# Patient Record
Sex: Female | Born: 1965 | Race: White | Hispanic: No | Marital: Married | Smoking: Never smoker
Health system: Southern US, Community
[De-identification: ages and names within clinical notes are randomized; demographics above are authoritative.]

## PROBLEM LIST (undated history)

## (undated) DIAGNOSIS — T7840XA Allergy, unspecified, initial encounter: Secondary | ICD-10-CM

## (undated) DIAGNOSIS — C210 Malignant neoplasm of anus, unspecified: Secondary | ICD-10-CM

## (undated) HISTORY — DX: Malignant neoplasm of anus, unspecified: C21.0

## (undated) HISTORY — PX: ABLATION: SHX5711

## (undated) HISTORY — PX: TUBAL LIGATION: SHX77

## (undated) HISTORY — DX: Allergy, unspecified, initial encounter: T78.40XA

---

## 2000-08-27 ENCOUNTER — Other Ambulatory Visit: Admission: RE | Admit: 2000-08-27 | Discharge: 2000-08-27 | Payer: Self-pay | Admitting: *Deleted

## 2002-01-08 ENCOUNTER — Other Ambulatory Visit: Admission: RE | Admit: 2002-01-08 | Discharge: 2002-01-08 | Payer: Self-pay | Admitting: *Deleted

## 2003-01-21 ENCOUNTER — Other Ambulatory Visit: Admission: RE | Admit: 2003-01-21 | Discharge: 2003-01-21 | Payer: Self-pay | Admitting: *Deleted

## 2005-08-09 ENCOUNTER — Other Ambulatory Visit: Admission: RE | Admit: 2005-08-09 | Discharge: 2005-08-09 | Payer: Self-pay | Admitting: *Deleted

## 2006-10-16 ENCOUNTER — Other Ambulatory Visit: Admission: RE | Admit: 2006-10-16 | Discharge: 2006-10-16 | Payer: Self-pay | Admitting: *Deleted

## 2007-11-03 ENCOUNTER — Other Ambulatory Visit: Admission: RE | Admit: 2007-11-03 | Discharge: 2007-11-03 | Payer: Self-pay | Admitting: *Deleted

## 2009-09-16 ENCOUNTER — Ambulatory Visit: Payer: Self-pay | Admitting: Family Medicine

## 2010-02-10 ENCOUNTER — Ambulatory Visit: Payer: Self-pay | Admitting: Family Medicine

## 2010-02-28 LAB — HM COLONOSCOPY

## 2010-03-10 ENCOUNTER — Encounter: Admission: RE | Admit: 2010-03-10 | Discharge: 2010-03-10 | Payer: Self-pay | Admitting: Gastroenterology

## 2010-03-13 ENCOUNTER — Ambulatory Visit: Payer: Self-pay | Admitting: Oncology

## 2010-03-14 ENCOUNTER — Ambulatory Visit: Admission: RE | Admit: 2010-03-14 | Discharge: 2010-04-13 | Payer: Self-pay | Admitting: Radiation Oncology

## 2010-03-16 ENCOUNTER — Ambulatory Visit (HOSPITAL_COMMUNITY): Admission: RE | Admit: 2010-03-16 | Discharge: 2010-03-16 | Payer: Self-pay | Admitting: Gastroenterology

## 2010-03-17 LAB — CBC WITH DIFFERENTIAL/PLATELET
BASO%: 0.4 % (ref 0.0–2.0)
Basophils Absolute: 0 10*3/uL (ref 0.0–0.1)
EOS%: 1.7 % (ref 0.0–7.0)
HCT: 38.2 % (ref 34.8–46.6)
MCHC: 33.5 g/dL (ref 31.5–36.0)
MONO%: 7.2 % (ref 0.0–14.0)
NEUT%: 61.6 % (ref 38.4–76.8)
Platelets: 263 10*3/uL (ref 145–400)
RDW: 13.9 % (ref 11.2–14.5)
lymph#: 1.7 10*3/uL (ref 0.9–3.3)

## 2010-03-17 LAB — COMPREHENSIVE METABOLIC PANEL
ALT: 14 U/L (ref 0–35)
Albumin: 4.5 g/dL (ref 3.5–5.2)
Alkaline Phosphatase: 90 U/L (ref 39–117)
Calcium: 9.6 mg/dL (ref 8.4–10.5)
Creatinine, Ser: 0.67 mg/dL (ref 0.40–1.20)
Glucose, Bld: 85 mg/dL (ref 70–99)
Potassium: 3.8 mEq/L (ref 3.5–5.3)
Sodium: 138 mEq/L (ref 135–145)
Total Bilirubin: 0.5 mg/dL (ref 0.3–1.2)
Total Protein: 7.3 g/dL (ref 6.0–8.3)

## 2010-03-17 LAB — HIV ANTIBODY (ROUTINE TESTING W REFLEX)

## 2010-03-22 ENCOUNTER — Ambulatory Visit (HOSPITAL_COMMUNITY): Admission: RE | Admit: 2010-03-22 | Discharge: 2010-03-22 | Payer: Self-pay | Admitting: Radiation Oncology

## 2010-04-12 ENCOUNTER — Ambulatory Visit: Payer: Self-pay | Admitting: Family Medicine

## 2010-04-14 ENCOUNTER — Encounter: Admission: RE | Admit: 2010-04-14 | Discharge: 2010-04-14 | Payer: Self-pay | Admitting: Family Medicine

## 2010-05-09 ENCOUNTER — Encounter (INDEPENDENT_AMBULATORY_CARE_PROVIDER_SITE_OTHER): Payer: Self-pay | Admitting: Surgery

## 2010-05-09 ENCOUNTER — Ambulatory Visit (HOSPITAL_COMMUNITY): Admission: RE | Admit: 2010-05-09 | Discharge: 2010-05-10 | Payer: Self-pay | Admitting: Surgery

## 2010-05-09 HISTORY — PX: ANUS SURGERY: SHX302

## 2010-05-30 ENCOUNTER — Ambulatory Visit: Payer: Self-pay | Admitting: Oncology

## 2010-06-01 LAB — CBC WITH DIFFERENTIAL/PLATELET
BASO%: 0.4 % (ref 0.0–2.0)
Eosinophils Absolute: 0.1 10*3/uL (ref 0.0–0.5)
HCT: 36.4 % (ref 34.8–46.6)
LYMPH%: 30.2 % (ref 14.0–49.7)
MCH: 27.9 pg (ref 25.1–34.0)
MCHC: 33.6 g/dL (ref 31.5–36.0)
MCV: 83.2 fL (ref 79.5–101.0)
MONO#: 0.6 10*3/uL (ref 0.1–0.9)
NEUT#: 3.4 10*3/uL (ref 1.5–6.5)
Platelets: 323 10*3/uL (ref 145–400)
RDW: 14.6 % — ABNORMAL HIGH (ref 11.2–14.5)

## 2010-06-01 LAB — COMPREHENSIVE METABOLIC PANEL
AST: 15 U/L (ref 0–37)
Alkaline Phosphatase: 99 U/L (ref 39–117)
Chloride: 103 mEq/L (ref 96–112)
Potassium: 4.1 mEq/L (ref 3.5–5.3)
Sodium: 137 mEq/L (ref 135–145)
Total Bilirubin: 0.3 mg/dL (ref 0.3–1.2)

## 2010-08-17 ENCOUNTER — Ambulatory Visit: Payer: Self-pay | Admitting: Family Medicine

## 2010-09-21 ENCOUNTER — Ambulatory Visit: Payer: Self-pay | Admitting: Oncology

## 2010-09-25 LAB — CBC WITH DIFFERENTIAL/PLATELET
BASO%: 0.2 % (ref 0.0–2.0)
Basophils Absolute: 0 10*3/uL (ref 0.0–0.1)
EOS%: 6 % (ref 0.0–7.0)
HCT: 38.9 % (ref 34.8–46.6)
LYMPH%: 33.3 % (ref 14.0–49.7)
MCH: 26.8 pg (ref 25.1–34.0)
MCV: 83.3 fL (ref 79.5–101.0)
NEUT#: 2.4 10*3/uL (ref 1.5–6.5)
Platelets: 285 10*3/uL (ref 145–400)
lymph#: 1.5 10*3/uL (ref 0.9–3.3)

## 2010-09-25 LAB — COMPREHENSIVE METABOLIC PANEL
Alkaline Phosphatase: 103 U/L (ref 39–117)
Chloride: 102 mEq/L (ref 96–112)
Potassium: 3.8 mEq/L (ref 3.5–5.3)
Sodium: 140 mEq/L (ref 135–145)
Total Bilirubin: 0.4 mg/dL (ref 0.3–1.2)
Total Protein: 7 g/dL (ref 6.0–8.3)

## 2010-12-17 ENCOUNTER — Encounter: Payer: Self-pay | Admitting: Oncology

## 2011-02-12 ENCOUNTER — Encounter: Payer: Self-pay | Admitting: Family Medicine

## 2011-02-12 LAB — PREGNANCY, URINE: Preg Test, Ur: NEGATIVE

## 2011-02-12 LAB — CBC: WBC: 5.6 10*3/uL (ref 4.0–10.5)

## 2011-02-13 LAB — GLUCOSE, CAPILLARY: Glucose-Capillary: 94 mg/dL (ref 70–99)

## 2011-02-16 ENCOUNTER — Ambulatory Visit (INDEPENDENT_AMBULATORY_CARE_PROVIDER_SITE_OTHER): Payer: 59 | Admitting: Family Medicine

## 2011-02-16 DIAGNOSIS — J0100 Acute maxillary sinusitis, unspecified: Secondary | ICD-10-CM

## 2011-02-16 DIAGNOSIS — D490 Neoplasm of unspecified behavior of digestive system: Secondary | ICD-10-CM

## 2011-02-16 DIAGNOSIS — J309 Allergic rhinitis, unspecified: Secondary | ICD-10-CM

## 2011-03-19 ENCOUNTER — Encounter (INDEPENDENT_AMBULATORY_CARE_PROVIDER_SITE_OTHER): Payer: 59 | Admitting: Family Medicine

## 2011-03-19 ENCOUNTER — Encounter: Payer: Self-pay | Admitting: Family Medicine

## 2011-03-19 DIAGNOSIS — N926 Irregular menstruation, unspecified: Secondary | ICD-10-CM

## 2011-03-19 DIAGNOSIS — Z0000 Encounter for general adult medical examination without abnormal findings: Secondary | ICD-10-CM

## 2011-04-24 ENCOUNTER — Ambulatory Visit (INDEPENDENT_AMBULATORY_CARE_PROVIDER_SITE_OTHER): Payer: 59 | Admitting: Medical

## 2011-04-24 ENCOUNTER — Encounter: Payer: Self-pay | Admitting: Medical

## 2011-04-24 VITALS — BP 102/72 | HR 60 | Temp 98.3°F | Ht 59.0 in | Wt 110.5 lb

## 2011-04-24 DIAGNOSIS — J329 Chronic sinusitis, unspecified: Secondary | ICD-10-CM

## 2011-04-24 MED ORDER — AMOXICILLIN 875 MG PO TABS: 875.0000 mg | ORAL_TABLET | Freq: Two times a day (BID) | ORAL | Status: DC

## 2011-04-24 NOTE — Patient Instructions (Signed)

## 2011-04-24 NOTE — Progress Notes (Signed)
Subjective:     Rebecca Bowers is a 45 y.o. female who presents for evaluation of sinus pain. Symptoms include: congestion, headaches, nasal congestion, sinus pressure and sore throat. Onset of symptoms was 4 days ago. Symptoms have been gradually worsening since that time. Past history is significant for allergy and sinus problems. Patient is a non-smoker.  Using Allegra and nasal saline flush for symptoms.  Denies sick contacts.  No other aggravating or relieving factors.  No other c/o.  The following portions of the patient's history were reviewed and updated as appropriate: allergies, current medications, past family history, past medical history, past social history, past surgical history and problem list.  Review of Systems Constitutional: +subj fever, denies chills, sweats, anorexia Skin: denies rash HEENT: +ear pressure; denies itchy watery eyes Cardiovascular: denies chest pain Lungs: +occasional cough, denies wheezing Abdomen: denies abdominal pain, nausea, vomiting, diarrhea GU: denies dysuria  Objective:   Filed Vitals:   04/24/11 1339  BP: 102/72  Pulse: 60  Temp: 98.3 F (36.8 C)    General appearance: Alert, WD/WN, no distress                             Skin: warm, no rash                           Head: +frontal and maxillary sinus tenderness,                            Eyes: conjunctiva normal, corneas clear, PERRLA                            Ears: pearly TMs, external ear canals normal                          Nose: septum midline, turbinates swollen, with erythema and clear discharge             Mouth/throat: MMM, tongue normal, mild pharyngeal erythema                           Neck: supple, no adenopathy, no thyromegaly, nontender                          Heart: RRR, normal S1, S2, no murmurs                         Lungs: CTA bilaterally, no wheezes, rales, or rhonchi      Assessment:    Acute bacterial sinusitis.    Plan:   Prescription given for  Amoxicillin.  Can use OTC Allegra or Mucinex for congestion.  Tylenol or Ibuprofen OTC for fever and malaise.  Discussed symptomatic relief, nasal saline, and call or return if worse or not improving in 2-3 days.

## 2011-07-19 ENCOUNTER — Encounter: Payer: Self-pay | Admitting: Family Medicine

## 2011-09-19 ENCOUNTER — Ambulatory Visit (INDEPENDENT_AMBULATORY_CARE_PROVIDER_SITE_OTHER): Payer: 59 | Admitting: Surgery

## 2011-09-19 ENCOUNTER — Encounter (INDEPENDENT_AMBULATORY_CARE_PROVIDER_SITE_OTHER): Payer: Self-pay | Admitting: Surgery

## 2011-09-19 VITALS — BP 114/68 | HR 60 | Temp 97.3°F | Resp 14 | Ht 59.0 in | Wt 113.8 lb

## 2011-09-19 DIAGNOSIS — D013 Carcinoma in situ of anus and anal canal: Secondary | ICD-10-CM | POA: Insufficient documentation

## 2011-09-19 NOTE — Progress Notes (Signed)
Subjective:     Patient ID: Rebecca Bowers, female   DOB: [birth year], 45 y.o.   MRN: [id number]  HPI  Patient Care Team: Carollee Herter, MD as PCP - General (Family Medicine) Theda Belfast as Consulting Physician (Gastroenterology) Jethro Bolus, MD as Consulting Physician (Hematology and Oncology) Billie Lade as Consulting Physician (Radiation Oncology)  This patient is a 45 y.o.female who presents today for surgical evaluation.   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 No anal pain. No rectal bleeding. Daily bowel movements. No fevers chills or sweats. Asymptomatic.  Past Medical History  Diagnosis Date  . Allergy   . 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 Tubal ligation     BILATERAL  . Cesarean section     X2  . Anus surgery 05/09/2010    Excision of AIN-III right & left anterior    History   Social History  . Marital Status: Married    Spouse Name: N/A    Number of Children: N/A  . Years of Education: N/A   Occupational History  . Not on file.   Social History Main Topics  . Smoking status: Never Smoker   . Smokeless tobacco: Never Used  . Alcohol Use: 0.5 oz/week    1 drink(s) per week     1 every 3-4 months  . Drug Use: No  . Sexually Active: Not on file   Other Topics Concern  . Not on file   Social History Narrative  . No narrative on file    Family History  Problem Relation Age of Onset  . Cancer Father 69    lung  . Cancer Sister     lung    Current outpatient prescriptions:Multiple Vitamin (MULTIVITAMIN) capsule, Take 1 capsule by mouth daily.  , Disp: , Rfl: ;  fexofenadine-pseudoephedrine (ALLEGRA-D 24) 180-240 MG per 24 hr tablet, Take 1 tablet by mouth daily.  , Disp: , Rfl:   No Known Allergies     Review of Systems  Constitutional: Negative for fever, chills,  diaphoresis, appetite change and fatigue.  HENT: Negative for ear pain, sore throat, trouble swallowing, neck pain and ear discharge.   Eyes: Negative for photophobia, discharge and visual disturbance.  Respiratory: Negative for cough, choking, chest tightness and shortness of breath.   Cardiovascular: Negative for chest pain and palpitations.  Gastrointestinal: Negative for nausea, vomiting, abdominal pain, diarrhea, constipation, blood in stool, abdominal distention, anal bleeding and rectal pain.  Genitourinary: Negative for dysuria, frequency and difficulty urinating.  Musculoskeletal: Negative for myalgias and gait problem.  Skin: Negative for color change, pallor and rash.  Neurological: Negative for dizziness, speech difficulty, weakness and numbness.  Hematological: Negative for adenopathy.  Psychiatric/Behavioral: Negative for confusion and agitation. The patient is not nervous/anxious.        Objective:   Physical Exam  Constitutional: She is oriented to person, place, and time. She appears well-developed and well-nourished. No distress.  HENT:  Head: Normocephalic.  Mouth/Throat: Oropharynx is clear and moist. No oropharyngeal exudate.  Eyes: Conjunctivae and EOM are normal. Pupils are equal, round, and reactive to light. No scleral icterus.  Neck: Normal range of motion. Neck supple. No tracheal deviation present.  Cardiovascular: Normal rate, regular rhythm and intact distal pulses.   Pulmonary/Chest: Effort  normal and breath sounds normal. No respiratory distress. She exhibits no tenderness.  Abdominal: Soft. She exhibits no distension and no mass. There is no tenderness. There is no guarding. Hernia confirmed negative in the right inguinal area and confirmed negative in the left inguinal area.       Incisions clean with normal healing ridges.  No hernias  Genitourinary: Vagina normal. No vaginal discharge found.       Perianal skin clean with good hygiene.  No pruritis.   Normal sphincter tone.  No fissure.  No abscess/fistula.  No pilonidal disease.    Tolerates digital and anoscopic rectal exam.  No rectal masses.   Hemorrhoidal piles WNL.  Anterior 50% circ scar at anosqamous junction.   No recurrence.  Mucosa clean   Musculoskeletal: Normal range of motion. She exhibits no tenderness.  Lymphadenopathy:    She has no cervical adenopathy.       Right: No inguinal adenopathy present.       Left: No inguinal adenopathy present.  Neurological: She is alert and oriented to person, place, and time. No cranial nerve deficit. She exhibits normal muscle tone. Coordination normal.  Skin: Skin is warm and dry. No rash noted. She is not diaphoretic. No erythema.  Psychiatric: She has a normal mood and affect. Her behavior is normal. Judgment and thought content normal.       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  The patient expressed understanding and appreciation

## 2011-12-10 ENCOUNTER — Ambulatory Visit (INDEPENDENT_AMBULATORY_CARE_PROVIDER_SITE_OTHER): Payer: 59 | Admitting: Family Medicine

## 2011-12-10 ENCOUNTER — Encounter: Payer: Self-pay | Admitting: Family Medicine

## 2011-12-10 VITALS — BP 108/70 | HR 76 | Temp 98.8°F | Ht 59.0 in | Wt 118.0 lb

## 2011-12-10 DIAGNOSIS — H8309 Labyrinthitis, unspecified ear: Secondary | ICD-10-CM

## 2011-12-10 NOTE — Patient Instructions (Signed)
URI with acute labyrinthitis.  Treat with conservative measures--continue Zyrtec D for sinus congestion.  Start sinus rinses and consider adding Mucinex (or other expectorant).  Trial of OTC meclizine, ie in Bonine, or less drowsy formula of Dramamine, and likely other OTC names of meclizine--can check with pharmacist.  May use 12.5-25 mg every 6-8 hours as needed for dizziness.  May cause some sedation.  Follow up if symptoms persist or worsen.  Labyrinthitis (Inner Ear Inflammation) Your exam shows you have an inner ear disturbance or labyrinthitis. The cause of this condition is not known. But it may be due to a virus infection. The symptoms of labyrinthitis include vertigo or dizziness made worse by motion, nausea and vomiting. The onset of labyrinthitis may be very sudden. It usually lasts for a few days and then clears up over 1-2 weeks. The treatment of an inner ear disturbance includes bed rest and medications to reduce dizziness, nausea, and vomiting. You should stay away from alcohol, tranquilizers, caffeine, nicotine, or any medicine your doctor thinks may make your symptoms worse. Further testing may be needed to evaluate your hearing and balance system. Please see your doctor or go to the emergency room right away if you have:  Increasing vertigo, earache, loss of hearing, or ear drainage.   Headache, blurred vision, trouble walking, fainting, or fever.   Persistent vomiting, dehydration, or extreme weakness.  Document Released: 11/12/2005 Document Revised: 07/25/2011 Document Reviewed: 04/30/2007 [facility] Patient Information 2012 [place], [place].

## 2011-12-10 NOTE — Progress Notes (Signed)
Chief complaint:  sinus drainage since Wed or Thurs of last week. Been experiencing dizziness since Saturday that patient equates to "feeling like I just got off boat."  HPI:  Began with postnasal drainage 4 days ago.  2 days ago started having problems with dizziness, worse yesterday. Dizziness is worse with movement, no specific position.  Has vertigo (room spins) when lying down.  Has been using Zyrtec-D daily (even prior to illness).  Not having any nasal drainage or blowing her nose.  Having some pressure in her cheeks and pressure in her right ear.  Denies any ringing in ear or hearing loss.  Coughs more when lying down, due to postnasal drip.  Occasionally gets up some discolored mucus.    Past Medical History  Diagnosis Date  . Allergy   . 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 Tubal ligation     BILATERAL  . Cesarean section     X2  . Anus surgery 05/09/2010    Excision of AIN-III right & left anterior    History   Social History  . Marital Status: Married    Spouse Name: N/A    Number of Children: N/A  . Years of Education: N/A   Occupational History  . shipping clerk    Social History Main Topics  . Smoking status: Never Smoker   . Smokeless tobacco: Never Used  . Alcohol Use: 0.5 oz/week    1 drink(s) per week     1 every 3-4 months  . Drug Use: No  . Sexually Active: Yes -- Female partner(s)    Birth Control/ Protection: Surgical     tubal ligation   Other Topics Concern  . Not on file   Social History Narrative  . No narrative on file   Family History  Problem Relation Age of Onset  . Cancer Father 71    lung  . Cancer Sister     lung   Current outpatient prescriptions:cetirizine-pseudoephedrine (ZYRTEC-D) 5-120 MG per tablet, Take 1 tablet by mouth 2 (two) times daily., Disp: , Rfl: ;  Multiple Vitamin (MULTIVITAMIN) capsule, Take 1 capsule by mouth daily.  , Disp: , Rfl:   No Known  Allergies  ROS:  Denies fevers.  Some nausea related to dizziness, but no vomiting or diarrhea.  Denies skin rash, body aches, chest pain, shortness of breath.  PHYSICAL EXAM: Well developed, pleasant female in no distress. BP 108/70  Pulse 76  Temp(Src) 98.8 F (37.1 C) (Oral)  Ht 4\' 11"  (1.499 m)  Wt 118 lb (53.524 kg)  BMI 23.83 kg/m2  LMP 11/16/2011 Increased dizziness when looking up and to the left (with just eye movement, no head movement). Some dizziness with turning head to left also. HEENT:  PERRL, EOMI, conjunctiva clear, fundi normal. TM's and EAC's normal, except mildly retracted on right.  No erythema Nasal mucosa mildly edematous, no erythema or purulence.  Tender over bilateral frontal sinuses.  OP clear without erythema Neck: no lymphadenopathy or mass Heart: regular rate and rhythm without murmur Lungs: clear bilaterally Neuro: alert and oriented x 3.  Normal strength, sensation, DTR's.  Cranial nerves intact.  Mild nystagmus with various eye movements.  Neuro exam normal  ASSESSMENT/PLAN:  1. Labyrinthitis, acute viral    URI with acute labyrinthitis.  No specific positional component demonstrated by history or exam Treat with conservative measures--continue Zyrtec D for sinus  congestion.  Start sinus rinses and consider adding Mucinex (or other expectorant).  Trial of OTC meclizine, ie in Bonine, or less drowsy formula of Dramamine, and likely other OTC names of meclizine--can check with pharmacist.  May use 12.5-25 mg every 6-8 hours as needed for dizziness.  May cause some sedation.

## 2012-03-24 ENCOUNTER — Encounter (INDEPENDENT_AMBULATORY_CARE_PROVIDER_SITE_OTHER): Payer: Self-pay | Admitting: Surgery

## 2012-03-24 ENCOUNTER — Ambulatory Visit (INDEPENDENT_AMBULATORY_CARE_PROVIDER_SITE_OTHER): Payer: 59 | Admitting: Surgery

## 2012-03-24 VITALS — BP 98/60 | HR 70 | Resp 16 | Ht 59.0 in | Wt 111.0 lb

## 2012-03-24 DIAGNOSIS — D013 Carcinoma in situ of anus and anal canal: Secondary | ICD-10-CM

## 2012-03-24 NOTE — Patient Instructions (Signed)
Anal intraepithelial neoplasia (AIN) Information for patients What is anal intraepithelial neoplasia (AIN)? AIN is the name given to the appearance of abnormal cells in the skin just inside or immediately outside the anus. Sometimes AIN occurs in both places at the same time, and in women, may occur at nearby sites of the vulva (VIN) and cervix (CIN). There are different grades of AIN according to how the cells look under the microscope, from AIN1 (minor changes) to AIN3 (more severe changes). Why is AIN important? AIN is important because there is a risk that it can transform into anal cancer over many years. Overall, this risk is low, though certain risk factors associated with immunodeficiency such as organ transplants and HIV infection, increase the risk. What are the symptoms of AIN? Most people who have AIN have no symptoms and don't know they have it. If symptoms do occur, they may include discolouration of the skin, itching, pain, lumpy skin or very rarely, bleeding. AIN3 does not cause more symptoms than AIN1. What causes AIN? The precise cause of AIN is unknown. However, AIN is often associated with the presence of a virus known as the human papilloma virus (HPV). Smoking, usually in combination with the HPV infection, accelerates the development of AIN. What is human papilloma virus (HPV) and what are the symptoms? HPV is one the most common viruses in the world. Most people who are sexually active will encounter HPV at some stage in their lives. The majority of people infected with HPV have no symptoms or signs and their immune system gets rid of it successfully. However, in a minority of people, the infection persists and they may then go on to develop warts or abnormal skin patches. How is HPV transmitted? HPV is spread by skin-to-skin contact during oral, vaginal or anal sex with an infected partner. How can transmission be prevented? The only way to prevent an HPV  infection is to avoid direct skin-to-skin contact with an infected person. Using condoms may partially reduce your risk of developing diseases linked to HPV. Condoms also provide excellent protection against other sexually transmitted infections. It is possible that the new vaccines designed to prevent cervical cancer may partially prevent the development of AIN and anal cancer, although long-term trials are still evaluating the outcomes. Diagnosis and Treatment What tests can be done to diagnose AIN? Close examination of the external anal area by a specialist may suggest a diagnosis of AIN. However, changes can often be very mild and easily missed. A biopsy or sample of tissue is taken (usually under a general anaesthetic) to obtain a definitive diagnosis and establish the grade of AIN. Some specialists use an anal smear (the 'Pap' smear) test or look at the anal skin with a highresolution visual instrument (an anoscopy). However, these methods are not commonly used and we are not certain how effective they are. 697 AIN Patient Information Page 2 of 2 How is AIN treated? If you have AIN, it is important that you discuss the matter with your specialist. The body can completely heal itself from AIN, particularly AIN1, so it may be appropriate to monitor you regularly. The most commonly used treatments are: surgical removal, laser or diathermy*. In a few cases, a specialist may prescribe skin ointments such as 5-flourouracil cream. Currently, we do not know whether these treatments prevent the development of anal cancer. *diathermy - high-frequency electromagnetic currents are used to generate local heat in body tissues to destroy abnormal cells. What should I expect after treatment?  If you have AIN3, close follow-up will be recommended with careful clinical examination at intervals of approximately 6 months over many years. If the specialist has concerns at these examinations, you may need  further biopsies. Other advice Smoking cessation Smoking makes cell changes happen more quickly, so we advise you to stop smoking. . A FREE smoking cessation service is available for all patients at [facility]. Contact the Smoking Cessation Therapists on 0161 446 8236. . [facility] can advise on complementary services nationwide 539 675 [phone] . QUIT(smokers' quitline provides support and advice) - 0800 00 22 00. Cervical cancer screening in women In many women with AIN, there is a link with abnormalities of the cervix. We recommend regular cervical screening. High risk patient groups HIV and immuno-compromised patients Patients who are immuno-compromised are at increased risk of the development of AIN, and when it occurs, the AIN to anal cancer process may speed up. These groups include: . HIV-positive patients . Men who have sex with men, HIV-negative . Renal (kidney) transplant patients . Cardiac (heart) transplan

## 2012-03-24 NOTE — Progress Notes (Signed)
Subjective:     Patient ID: Rebecca Bowers, female   DOB: 1966-02-09, 46 y.o.   MRN: [id number]  HPI   Patient Care Team: Ronnald Nian, MD as PCP - General (Family Medicine) Theda Belfast, MD as Consulting Physician (Gastroenterology) Exie Parody, MD as Consulting Physician (Hematology and Oncology) Billie Lade, MD as Consulting Physician (Radiation Oncology)  This patient is a 46 y.o.female who presents today for surgical evaluation.   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 No anal pain. No rectal bleeding. Daily bowel movements. No fevers chills or sweats. Asymptomatic.  Past Medical History  Diagnosis Date  . Allergy   . 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 Tubal ligation     BILATERAL  . Cesarean section     X2  . Anus surgery 05/09/2010    Excision of AIN-III right & left anterior    History   Social History  . Marital Status: Married    Spouse Name: N/A    Number of Children: N/A  . Years of Education: N/A   Occupational History  . shipping clerk    Social History Main Topics  . Smoking status: Never Smoker   . Smokeless tobacco: Never Used  . Alcohol Use: 0.5 oz/week    1 drink(s) per week     1 every 3-4 months  . Drug Use: No  . Sexually Active: Yes -- Female partner(s)    Birth Control/ Protection: Surgical     tubal ligation   Other Topics Concern  . Not on file   Social History Narrative  . No narrative on file    Family History  Problem Relation Age of Onset  . Cancer Father 22    lung  . Cancer Sister     lung    Current outpatient prescriptions:cetirizine-pseudoephedrine (ZYRTEC-D) 5-120 MG per tablet, Take 1 tablet by mouth 2 (two) times daily., Disp: , Rfl: ;  Multiple Vitamin (MULTIVITAMIN) capsule, Take 1 capsule by mouth daily.  , Disp: , Rfl:   No Known  Allergies     Review of Systems  Constitutional: Negative for fever, chills, diaphoresis, appetite change and fatigue.  HENT: Negative for ear pain, sore throat, trouble swallowing, neck pain and ear discharge.   Eyes: Negative for photophobia, discharge and visual disturbance.  Respiratory: Negative for cough, choking, chest tightness and shortness of breath.   Cardiovascular: Negative for chest pain and palpitations.  Gastrointestinal: Negative for nausea, vomiting, abdominal pain, diarrhea, constipation, blood in stool, abdominal distention, anal bleeding and rectal pain.  Genitourinary: Negative for dysuria, frequency and difficulty urinating.  Musculoskeletal: Negative for myalgias and gait problem.  Skin: Negative for color change, pallor and rash.  Neurological: Negative for dizziness, speech difficulty, weakness and numbness.  Hematological: Negative for adenopathy.  Psychiatric/Behavioral: Negative for confusion and agitation. The patient is not nervous/anxious.        Objective:   Physical Exam  Constitutional: She is oriented to person, place, and time. She appears well-developed and well-nourished. No distress.  HENT:  Head: Normocephalic.  Mouth/Throat: Oropharynx is clear and moist. No oropharyngeal exudate.  Eyes: Conjunctivae and EOM are normal. Pupils are equal, round, and reactive to light. No scleral icterus.  Neck: Normal range of motion. Neck supple. No tracheal  deviation present.  Cardiovascular: Normal rate, regular rhythm and intact distal pulses.   Pulmonary/Chest: Effort normal and breath sounds normal. No respiratory distress. She exhibits no tenderness.  Abdominal: Soft. She exhibits no distension and no mass. There is no tenderness. There is no guarding. Hernia confirmed negative in the right inguinal area and confirmed negative in the left inguinal area.       Incisions clean with normal healing ridges.  No hernias  Genitourinary: Vagina normal. No  vaginal discharge found.       Perianal skin clean with good hygiene.  No pruritis.  Normal sphincter tone.  No fissure.  No abscess/fistula.  No pilonidal disease.    Tolerates digital and anoscopic rectal exam.  No rectal masses.   Hemorrhoidal piles WNL.  Anterior 50% circ scar at anosqamous junction.   No recurrence.  Mucosa clean   Musculoskeletal: Normal range of motion. She exhibits no tenderness.  Lymphadenopathy:    She has no cervical adenopathy.       Right: No inguinal adenopathy present.       Left: No inguinal adenopathy present.  Neurological: She is alert and oriented to person, place, and time. No cranial nerve deficit. She exhibits normal muscle tone. Coordination normal.  Skin: Skin is warm and dry. No rash noted. She is not diaphoretic. No erythema.  Psychiatric: She has a normal mood and affect. Her behavior is normal. Judgment and thought content normal.       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  The patient expressed understanding and appreciation

## 2012-11-04 ENCOUNTER — Encounter: Payer: Self-pay | Admitting: Medical

## 2012-11-04 ENCOUNTER — Ambulatory Visit (INDEPENDENT_AMBULATORY_CARE_PROVIDER_SITE_OTHER): Payer: 59 | Admitting: Medical

## 2012-11-04 ENCOUNTER — Telehealth: Payer: Self-pay | Admitting: Family Medicine

## 2012-11-04 VITALS — BP 110/70 | HR 80 | Temp 98.3°F | Resp 16 | Wt 114.0 lb

## 2012-11-04 DIAGNOSIS — J069 Acute upper respiratory infection, unspecified: Secondary | ICD-10-CM

## 2012-11-04 MED ORDER — HYDROCODONE-HOMATROPINE 5-1.5 MG/5ML PO SYRP: 5.0000 mL | ORAL_SOLUTION | Freq: Three times a day (TID) | ORAL | Status: DC | PRN

## 2012-11-04 MED ORDER — AMOXICILLIN 875 MG PO TABS: 875.0000 mg | ORAL_TABLET | Freq: Two times a day (BID) | ORAL | Status: DC

## 2012-11-04 MED ORDER — AMOXICILLIN 875 MG PO TABS: 875.0000 mg | ORAL_TABLET | Freq: Two times a day (BID) | ORAL | Status: AC

## 2012-11-04 NOTE — Progress Notes (Signed)
Subjective:  Rebecca Bowers is a 46 y.o. female who presents for 6 day hx/o head and chest congestion, sore throat, feels like mucous hanging up in throat, some fatigue, post nasal drainage, feels sick, some nausea.  No fever, vomiting, diarrhea, ear pain.  She does have productive cough, yellow green sputum.  She uses Zyrtec D and uses nasal saline daily, but also using Tylenol Cold medication.  No sick contacts.  No other aggravating or relieving factors.  No other c/o.  Past Medical History  Diagnosis Date  . Allergy   . 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   ROS as in HPI  Objective:   Filed Vitals:   11/04/12 0918  BP: 110/70  Pulse: 80  Temp: 98.3 F (36.8 C)  Resp: 16    General appearance: Alert, WD/WN, no distress, mildly ill appearing                             Skin: warm, no rash                           Head: maxillary sinus tenderness                            Eyes: conjunctiva normal, corneas clear, PERRLA                            Ears: pearly TMs, external ear canals normal                          Nose: septum midline, turbinates swollen, with erythema but no discharge             Mouth/throat: MMM, tongue normal, mild pharyngeal erythema                           Neck: supple, no adenopathy, no thyromegaly, nontender                          Heart: RRR, normal S1, S2, no murmurs                         Lungs: CTA bilaterally, no wheezes, rales, or rhonchi     Assessment and Plan:     Encounter Diagnosis  Name Primary?  . URI (upper respiratory infection) Yes    Discussed diagnosis and treatment of URI.  C/t Zyrtec D, nasal saline spray for congestion, Ibuprofen OTC for fever and malaise.  Can add Mucinex DM.  If not improving in the next few days, can begin amoxicillin.  Hycodan script for bad cough QHS.  Call/return if symptoms aren't resolving.

## 2012-11-04 NOTE — Telephone Encounter (Signed)
Message copied by Janeice Robinson on Tue Nov 04, 2012 11:25 AM ------      Message from: Aleen Campi, Kermit Balo      Created: Tue Nov 04, 2012 10:02 AM       pls call in the amoxicillin and hycodan syrup.  My printing function isn't working.

## 2012-11-04 NOTE — Telephone Encounter (Signed)
I CALLED THE 2 MEDICATIONS IN TO THE PATIENTS PHARMACY PER DAVID TYSINGER PA-C. CLS

## 2013-03-12 ENCOUNTER — Other Ambulatory Visit: Payer: Self-pay | Admitting: Obstetrics and Gynecology

## 2013-03-12 DIAGNOSIS — R928 Other abnormal and inconclusive findings on diagnostic imaging of breast: Secondary | ICD-10-CM

## 2013-03-24 ENCOUNTER — Encounter (INDEPENDENT_AMBULATORY_CARE_PROVIDER_SITE_OTHER): Payer: Self-pay | Admitting: Surgery

## 2013-03-24 ENCOUNTER — Ambulatory Visit (INDEPENDENT_AMBULATORY_CARE_PROVIDER_SITE_OTHER): Payer: 59 | Admitting: Surgery

## 2013-03-24 VITALS — BP 118/58 | HR 80 | Temp 97.1°F | Resp 16 | Ht 59.0 in | Wt 111.0 lb

## 2013-03-24 DIAGNOSIS — D013 Carcinoma in situ of anus and anal canal: Secondary | ICD-10-CM

## 2013-03-24 NOTE — Patient Instructions (Addendum)
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 In the early stages of AIN there are abnormal skin cells (epithelial cells) in the outer third of the skin (epithelium). This is called AIN1 (also called a low-grade anal squamous intra-epithelial lesion (LSIL). This can progress to AIN2, where the outer 2/3 of the skin contains abnormal cells, which in turn can result in AIN3 where there are abnormal cells involving the full thickness of skin (AIN2 and AIN3 are also called high-grade anal squamous intra-epithelial lesions (HSIL). The next step is for these abnormal cells to cross a barrier at the base of the skin called the basement membrane. Once this occurs, it is invasive cancer (carcinoma).  RISK FACTORS The risk factors for AIN are the same as for anal cancer, and include the immunosuppressed (eg HIV and transplant patients), and those with previous anal warts. PREVENTION The vaccine currently offered to women to reduce the risk of cervical cancer targets the Human Papilloma Virus (HPV) serotypes 16, 18, 6 and 11. Although not approved for this purpose, this vaccine may have a role for reducing the risk of AIN and anal cancer in high risk populations who do not yet have HPV infection. TREATMENT OF AIN Treatment is aimed at eradicating AIN and preventing anal cancer with minimal disturbance to anal function. There is currently no uniform treatment largely due to the uncertain natural history of AIN, the variable extent of disease, and the fact there is not a universally effective treatment modality.  TOPICAL AGENTS Imiquimod cream 5% (Aldara ) can be applied to the area 3 times a day, and can be used for up to 16 weeks. It is an immunomodulatory agent and that attacks the virus responsible for warts, but also has anti-tumour effects [1]. There is some evidence that it not only  slows the progression of AIN, but causes regression, with one study showing that 3/4 of men with AIN had their AIN completely cleared at the end of treatment[2]. It also has the added benefit in those with warts of causing regression of these, and in some cases eradication of the HPV virus. The main disadvantage is relapse in a quarter of cases after cessation of therapy[1]. Side effects of imiquimod include erythema, "flu?like" illness and erosions, which are usually mild. It should not be applied prior to sexual intercourse. A systematic review showed a noncompliance rate of less than 5% because of side effects, mainly related to incompatibility with sexual life [3-4]. Topical 5-Fluorouracil 5% cream (Efudix) can be applied to the area 1-2 times a day, and the largest study showing a recurrence of high grade AIN in only 8% of patients when used for 9-12 weeks[5]. PHOTODYNAMIC THERAPY Photodynamic therapy (PDT) involves the application of a cream (topical sensitizer) such as 5-Flourouracil (Efudix) and subsequent exposure of the anal region to light and oxygen to generate oxygen intermediates that damage areas with AIN [6]. Although there is little evidence, there is a suggestion that early AIN responds to this treatment [7-8]. INFRARED PHOTOCOAGULATION Infrared photocoagulation is the same as photodynamic therapy except that it only uses light with a wavelength longer than visible light. It's use for AIN was first described by Goldstein and colleagues [9], who showed that up to 2/3 of AIN can be cured and is effective in preventing progression to cancer.  SURGERY The treatment of widespread AIN3 is controversial. Most advocate surgery, however even with surgery there is a   one third risk of recurrence [10-11]. If surgery is performed, it consists of either local excision or extensive excision with split skin grafting. The high recurrence rate with these procedures is thought to be related to ongoing HPV,  multi?focal lesions that are not all treated, and a generalised field change. [place] of the high recurrence rate and extensive surgery required for widespread AIN3, many advocate just close surveillance with regular (3-6 monthly) biopsies, so that early invasive anal cancer can be picked up early and treated accordingly. RADIOTHERAPY Radiotherapy has no role for AIN 3. Its only role is for confirmed anal cancer. FOLLOW-UP Follow?up should involve anoscopic examination, with or without the aid of high?resolution anoscopy. AIN 1 should be reviewed every 6?12 months with discharge from follow up upon resolution of AIN. AIN 3, especially in HIV?positive patients should be followed more closely every 3?6 months with or without treatment. The follow?up of AIN 2 is less clear, as the natural history of this condition is so uncertain. A regime somewhere between that of AIN 1 and 3 is advised, with HIV?positive or immunosuppressed patients being followed more like patients with AIN 3.   GETTING TO GOOD BOWEL HEALTH. Irregular bowel habits such as constipation and diarrhea can lead to many problems over time.  Having one soft bowel movement a day is the most important way to prevent further problems.  The anorectal canal is designed to handle stretching and feces to safely manage our ability to get rid of solid waste (feces, poop, stool) out of our body.  BUT, hard constipated stools can act like ripping concrete bricks and diarrhea can be a burning fire to this very sensitive area of our body, causing inflamed hemorrhoids, anal fissures, increasing risk is perirectal abscesses, abdominal pain/bloating, an making irritable bowel worse.     The goal: ONE SOFT BOWEL MOVEMENT A DAY!  To have soft, regular bowel movements:    Drink at least 8 tall glasses of water a day.     Take plenty of fiber.  Fiber is the undigested part of plant food that passes into the colon, acting s "natures broom" to encourage bowel motility  and movement.  Fiber can absorb and hold large amounts of water. This results in a larger, bulkier stool, which is soft and easier to pass. Work gradually over several weeks up to 6 servings a day of fiber (25g a day even more if needed) in the form of: o Vegetables -- Root (potatoes, carrots, turnips), leafy green (lettuce, salad greens, celery, spinach), or cooked high residue (cabbage, broccoli, etc) o Fruit -- Fresh (unpeeled skin & pulp), Dried (prunes, apricots, cherries, etc ),  or stewed ( applesauce)  o Whole grain breads, pasta, etc (whole wheat)  o Bran cereals    Bulking Agents -- This type of water-retaining fiber generally is easily obtained each day by one of the following:  o Psyllium bran -- The psyllium plant is remarkable because its ground seeds can retain so much water. This product is available as Metamucil, Konsyl, Effersyllium, Per Diem Fiber, or the less expensive generic preparation in drug and health food stores. Although labeled a laxative, it really is not a laxative.  o Methylcellulose -- This is another fiber derived from wood which also retains water. It is available as Citrucel. o Polyethylene Glycol - and "artificial" fiber commonly called Miralax or Glycolax.  It is helpful for people with gassy or bloated feelings with regular fiber o Flax Seed - a less gassy   fiber than psyllium   No reading or other relaxing activity while on the toilet. If bowel movements take longer than 5 minutes, you are too constipated   AVOID CONSTIPATION.  High fiber and water intake usually takes care of this.  Sometimes a laxative is needed to stimulate more frequent bowel movements, but    Laxatives are not a good long-term solution as it can wear the colon out. o Osmotics (Milk of Magnesia, Fleets phosphosoda, Magnesium citrate, MiraLax, GoLytely) are safer than  o Stimulants (Senokot, Castor Oil, Dulcolax, Ex Lax)    o Do not take laxatives for more than 7days in a row.    IF SEVERELY  CONSTIPATED, try a Bowel Retraining Program: o Do not use laxatives.  o Eat a diet high in roughage, such as bran cereals and leafy vegetables.  o Drink six (6) ounces of prune or apricot juice each morning.  o Eat two (2) large servings of stewed fruit each day.  o Take one (1) heaping tablespoon of a psyllium-based bulking agent twice a day. Use sugar-free sweetener when possible to avoid excessive calories.  o Eat a normal breakfast.  o Set aside 15 minutes after breakfast to sit on the toilet, but do not strain to have a bowel movement.  o If you do not have a bowel movement by the third day, use an enema and repeat the above steps.    Controlling diarrhea o Switch to liquids and simpler foods for a few days to avoid stressing your intestines further. o Avoid dairy products (especially milk & ice cream) for a short time.  The intestines often can lose the ability to digest lactose when stressed. o Avoid foods that cause gassiness or bloating.  Typical foods include beans and other legumes, cabbage, broccoli, and dairy foods.  Every person has some sensitivity to other foods, so listen to our body and avoid those foods that trigger problems for you. o Adding fiber (Citrucel, Metamucil, psyllium, Miralax) gradually can help thicken stools by absorbing excess fluid and retrain the intestines to act more normally.  Slowly increase the dose over a few weeks.  Too much fiber too soon can backfire and cause cramping & bloating. o Probiotics (such as active yogurt, Align, etc) may help repopulate the intestines and colon with normal bacteria and calm down a sensitive digestive tract.  Most studies show it to be of mild help, though, and such products can be costly. o Medicines:   Bismuth subsalicylate (ex. Kayopectate, Pepto Bismol) every 30 minutes for up to 6 doses can help control diarrhea.  Avoid if pregnant.   Loperamide (Immodium) can slow down diarrhea.  Start with two tablets (4mg total) first  and then try one tablet every 6 hours.  Avoid if you are having fevers or severe pain.  If you are not better or start feeling worse, stop all medicines and call your doctor for advice o Call your doctor if you are getting worse or not better.  Sometimes further testing (cultures, endoscopy, X-ray studies, bloodwork, etc) may be needed to help diagnose and treat the cause of the diarrhea. o  

## 2013-03-24 NOTE — Progress Notes (Signed)
Subjective:     Patient ID: Rebecca Bowers, female   DOB: [birth year], 47 y.o.   MRN: [id number]  HPI   Patient Care Team: Ronnald Nian, MD as PCP - General (Family Medicine) Theda Belfast, MD as Consulting Physician (Gastroenterology) Exie Parody, MD as Consulting Physician (Hematology and Oncology) Billie Lade, MD as Consulting Physician (Radiation Oncology)  This patient is a 47 y.o.female who presents today for surgical evaluation.   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 No new evidence.  Getting followup films for mammographic abnormality but otherwise healthy.  No anal pain. No rectal bleeding. Daily bowel movements. No fevers chills or sweats. Asymptomatic.  Past Medical History  Diagnosis Date  . Allergy   . 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 Tubal ligation      BILATERAL  . Cesarean section      X2  . Anus surgery  05/09/2010    Excision of AIN-III right & left anterior    History   Social History  . Marital Status: Married    Spouse Name: N/A    Number of Children: N/A  . Years of Education: N/A   Occupational History  . shipping clerk    Social History Main Topics  . Smoking status: Never Smoker   . Smokeless tobacco: Never Used  . Alcohol Use: 0.5 oz/week    1 drink(s) per week     Comment: 1 every 3-4 months  . Drug Use: No  . Sexually Active: Yes -- Female partner(s)    Birth Control/ Protection: Surgical     Comment: tubal ligation   Other Topics Concern  . Not on file   Social History Narrative  . No narrative on file    Family History  Problem Relation Age of Onset  . Cancer Father 1    lung  . Cancer Sister     lung    Current outpatient prescriptions:estradiol (ESTRACE) 2 MG tablet, , Disp: , Rfl: ;  Multiple Vitamin (MULTIVITAMIN) capsule, Take 1  capsule by mouth daily.  , Disp: , Rfl: ;  progesterone (PROMETRIUM) 100 MG capsule, , Disp: , Rfl: ;  cetirizine-pseudoephedrine (ZYRTEC-D) 5-120 MG per tablet, Take 1 tablet by mouth 2 (two) times daily., Disp: , Rfl:   No Known Allergies     Review of Systems  Constitutional: Negative for fever, chills, diaphoresis, appetite change and fatigue.  HENT: Negative for ear pain, sore throat, trouble swallowing, neck pain and ear discharge.   Eyes: Negative for photophobia, discharge and visual disturbance.  Respiratory: Negative for cough, choking, chest tightness and shortness of breath.   Cardiovascular: Negative for chest pain and palpitations.  Gastrointestinal: Negative for nausea, vomiting, abdominal pain, diarrhea, constipation, blood in stool, abdominal distention, anal bleeding and rectal pain.  Genitourinary: Negative for dysuria, frequency and difficulty urinating.  Musculoskeletal: Negative for myalgias and gait problem.  Skin: Negative for color change, pallor and rash.  Neurological: Negative for dizziness, speech difficulty, weakness and numbness.  Hematological: Negative for adenopathy.  Psychiatric/Behavioral: Negative for confusion and agitation. The patient is not nervous/anxious.        Objective:   Physical Exam  Constitutional: She is oriented to person, place, and time. She appears well-developed and well-nourished. No distress.  HENT:  Head: Normocephalic.  Mouth/Throat: Oropharynx is clear and moist. No oropharyngeal exudate.  Eyes: Conjunctivae and EOM are normal. Pupils are equal, round, and reactive to light. No scleral icterus.  Neck: Normal range of motion. Neck supple. No tracheal deviation present.  Cardiovascular: Normal rate, regular rhythm and intact distal pulses.   Pulmonary/Chest: Effort normal and breath sounds normal. No respiratory distress. She exhibits no tenderness.  Abdominal: Soft. She exhibits no distension and no mass. There is no  tenderness. There is no guarding. Hernia confirmed negative in the right inguinal area and confirmed negative in the left inguinal area.  Incisions clean with normal healing ridges.  No hernias  Genitourinary: Vagina normal. No vaginal discharge found.  Perianal skin clean with good hygiene.  No pruritis.  Normal sphincter tone.  No fissure.  No abscess/fistula.  No pilonidal disease.  Tolerates digital and anoscopic rectal exam.  No rectal masses.   Hemorrhoidal piles WNL.  Anterior 50% circ flat scar at anosqamous junction.   No recurrence.  Mucosa clean   Musculoskeletal: Normal range of motion. She exhibits no tenderness.  Lymphadenopathy:    She has no cervical adenopathy.       Right: No inguinal adenopathy present.       Left: No inguinal adenopathy present.  Neurological: She is alert and oriented to person, place, and time. No cranial nerve deficit. She exhibits normal muscle tone. Coordination normal.  Skin: Skin is warm and dry. No rash noted. She is not diaphoretic. No erythema.  Psychiatric: She has a normal mood and affect. Her behavior is normal. Judgment and thought content normal.       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  Low impact exercise such as walking an hour a day at least ideal.  Do not push through pain.  Diet as tolerated.  Low fat high fiber diet ideal.  Bowel regimen with 30 g fiber a day and fiber supplement as needed to avoid problems.  Instructions discussed.  Followup with primary care physician for other health issues as would normally be done.  Questions answered.  The patient expressed understanding and appreciation    The patient expressed understanding and appreciation

## 2013-03-25 ENCOUNTER — Ambulatory Visit
Admission: RE | Admit: 2013-03-25 | Discharge: 2013-03-25 | Disposition: A | Discharge status: Home / Self Care | Payer: 59 | Source: Ambulatory Visit | Attending: Obstetrics and Gynecology | Admitting: Obstetrics and Gynecology

## 2013-03-25 DIAGNOSIS — R928 Other abnormal and inconclusive findings on diagnostic imaging of breast: Secondary | ICD-10-CM

## 2015-04-14 ENCOUNTER — Other Ambulatory Visit: Payer: Self-pay | Admitting: Obstetrics and Gynecology

## 2015-04-15 LAB — CYTOLOGY - PAP

## 2016-02-06 ENCOUNTER — Encounter: Payer: Self-pay | Admitting: Family Medicine

## 2016-02-06 ENCOUNTER — Ambulatory Visit (INDEPENDENT_AMBULATORY_CARE_PROVIDER_SITE_OTHER): Payer: BLUE CROSS/BLUE SHIELD | Admitting: Family Medicine

## 2016-02-06 VITALS — BP 140/82 | HR 64 | Wt 119.8 lb

## 2016-02-06 DIAGNOSIS — K625 Hemorrhage of anus and rectum: Secondary | ICD-10-CM | POA: Diagnosis not present

## 2016-02-06 DIAGNOSIS — Z85048 Personal history of other malignant neoplasm of rectum, rectosigmoid junction, and anus: Secondary | ICD-10-CM

## 2016-02-06 LAB — CBC WITH DIFFERENTIAL/PLATELET
BASOS PCT: 0 % (ref 0–1)
Basophils Absolute: 0 10*3/uL (ref 0.0–0.1)
EOS ABS: 0.1 10*3/uL (ref 0.0–0.7)
Eosinophils Relative: 1 % (ref 0–5)
HCT: 42.2 % (ref 36.0–46.0)
HEMOGLOBIN: 14.2 g/dL (ref 12.0–15.0)
Lymphocytes Relative: 34 % (ref 12–46)
Lymphs Abs: 2.1 10*3/uL (ref 0.7–4.0)
MCH: 30.6 pg (ref 26.0–34.0)
MCHC: 33.6 g/dL (ref 30.0–36.0)
MCV: 90.9 fL (ref 78.0–100.0)
MPV: 10.4 fL (ref 8.6–12.4)
Monocytes Absolute: 0.4 10*3/uL (ref 0.1–1.0)
Monocytes Relative: 7 % (ref 3–12)
NEUTROS ABS: 3.6 10*3/uL (ref 1.7–7.7)
NEUTROS PCT: 58 % (ref 43–77)
PLATELETS: 315 10*3/uL (ref 150–400)
RBC: 4.64 MIL/uL (ref 3.87–5.11)
RDW: 12.9 % (ref 11.5–15.5)
WBC: 6.2 10*3/uL (ref 4.0–10.5)

## 2016-02-06 LAB — COMPREHENSIVE METABOLIC PANEL
ALBUMIN: 4 g/dL (ref 3.6–5.1)
ALK PHOS: 72 U/L (ref 33–130)
ALT: 18 U/L (ref 6–29)
AST: 16 U/L (ref 10–35)
BILIRUBIN TOTAL: 0.3 mg/dL (ref 0.2–1.2)
BUN: 15 mg/dL (ref 7–25)
CALCIUM: 10 mg/dL (ref 8.6–10.4)
CO2: 28 mmol/L (ref 20–31)
Chloride: 102 mmol/L (ref 98–110)
Creat: 0.6 mg/dL (ref 0.50–1.05)
GLUCOSE: 82 mg/dL (ref 65–99)
POTASSIUM: 4.4 mmol/L (ref 3.5–5.3)
Sodium: 138 mmol/L (ref 135–146)
Total Protein: 6.6 g/dL (ref 6.1–8.1)

## 2016-02-06 LAB — HEMOCCULT GUIAC POC 1CARD (OFFICE): FECAL OCCULT BLD: NEGATIVE

## 2016-02-06 NOTE — Progress Notes (Signed)
   Subjective:    Patient ID: Rebecca Bowers, female    DOB: 03/30/1966, 50 y.o.   MRN: [id number]  HPI Chief Complaint  Patient presents with  . rectal bleeding    rectal bleeding thursday, friday and saturday. has a history of anal/rectal and bleeding was a sign. Dr. Benson Norway is her GI doctor   She is here for complaints of a 4 day history of bright red blood per rectum. States bleeding a "moderate" amount. Bleeding is only with bowel movements. Last time she had bleeding like this she was told she had anal cancer.  She had surgery on anus in 2011. No chemo or radiation history. She last saw her surgeon about 2 years ago for follow up. She had a colonoscopy in 2011.   She denies fever, chills, dizziness, chest pain, DOE, abdominal pain or rectal pain.  She stopped having periods 2 years and is taking hormones for hot flashes and menopause symptoms. Has history of cysts on ovaries.   Other providers: OB/GYN Dr. Corinna Capra, surgeon: Dr. Johney Maine- saw him 2 years ago for anal cancer. GI - Dr. Benson Norway   Review of Systems Pertinent positives and negatives in the history of present illness.     Objective:   Physical Exam  Constitutional: She is oriented to person, place, and time. She appears well-developed and well-nourished. No distress.  Cardiovascular: Normal rate, regular rhythm, normal heart sounds and intact distal pulses.   No murmur heard. Pulmonary/Chest: Effort normal and breath sounds normal.  Abdominal: Soft. Bowel sounds are normal. She exhibits no distension and no mass. There is no hepatosplenomegaly. There is no tenderness. There is no rebound, no guarding and no CVA tenderness.  Genitourinary: Rectum normal. Rectal exam shows no external hemorrhoid, no tenderness and anal tone normal. Guaiac negative stool.  Neurological: She is alert and oriented to person, place, and time.  Skin: Skin is warm and dry. No pallor.  Psychiatric: She has a normal mood and affect. Her behavior is  normal. Judgment and thought content normal.   BP 140/82 mmHg  Pulse 64  Wt 119 lb 12.8 oz (54.341 kg)  LMP 11/16/2011  Hemoccult: negative    Assessment & Plan:  Rectal bleeding - Plan: CBC with Differential/Platelet, POCT occult blood stool, Comprehensive metabolic panel, Ambulatory referral to Gastroenterology  History of rectal or anal cancer - Plan: Ambulatory referral to Gastroenterology  Discussed that she is negative for occult blood in the office but due to history and that she is overdue for colonoscopy I will refer her back to Dr. Benson Norway for further evaluation. She appears to be doing well and is asymptomatic except for rectal bleeding.  Hemoccult card negative. Referral made back to Dr. Benson Norway and appointment scheduled for March 15th.  Follow up in 1 month.

## 2016-02-06 NOTE — Patient Instructions (Signed)
You have an appointment with Dr. Benson Norway on 02/08/2016 at 2:15 PM.

## 2016-03-12 ENCOUNTER — Ambulatory Visit (INDEPENDENT_AMBULATORY_CARE_PROVIDER_SITE_OTHER): Payer: BLUE CROSS/BLUE SHIELD | Admitting: Family Medicine

## 2016-03-12 ENCOUNTER — Encounter: Payer: Self-pay | Admitting: Family Medicine

## 2016-03-12 VITALS — BP 118/72 | HR 64 | Wt 122.4 lb

## 2016-03-12 DIAGNOSIS — R6884 Jaw pain: Secondary | ICD-10-CM | POA: Diagnosis not present

## 2016-03-12 DIAGNOSIS — Z8719 Personal history of other diseases of the digestive system: Secondary | ICD-10-CM

## 2016-03-12 DIAGNOSIS — G8929 Other chronic pain: Secondary | ICD-10-CM | POA: Insufficient documentation

## 2016-03-12 NOTE — Patient Instructions (Signed)
Call Dr. Vanessa Kick and let him know your jaw is not getting any better. Let me know if you need referral to oral surgery.  Schedule physical and fasting labs with me in July or August.

## 2016-03-12 NOTE — Progress Notes (Signed)
   Subjective:    Patient ID: Rebecca Bowers, female    DOB: Feb 07, 1966, 50 y.o.   MRN: [id number]  HPI Chief Complaint  Patient presents with  . follow-up    follow-up from rectal bleeding   She is here for a 1 month follow up on rectal bleeding. She denies any blood in stool or rectal bleeding otherwise since our last visit. Denies fever, chills, abdominal pain, nausea, vomiting. She saw Dr. Benson Norway and states he told her her colonoscopy was normal other than scar tissue being present. She states she was given ointment to put on her rectum and told to keep her bowls soft if she notices any bleeding.  She will be due in 10 years for repeat colonsocpy. She will follow up with Dr. Benson Norway in 5 years. Will need to get record from Dr. Benson Norway office.   She has a complaint of right jaw pain for approximately 1 year or so. Pain is only with chewing and to right side, non radiating. Denies pain at present. Denies locking, popping. States she talked to dentist about this last year. She has seen the dentist for this again 3 months ago and was given muscle relaxant and told to use ice to jaw.  Dr. Vanessa Kick is her dentist.  States she will call him and get the referral that they discussed.   Has appointment in June for mammogram and pap smear. Sees Dr. Corinna Capra OB/GYN.  No other concerns today.   Needs physical and fasting.   Review of Systems Pertinent positives and negatives in the history of present illness.     Objective:   Physical Exam BP 118/72 mmHg  Pulse 64  Wt 122 lb 6.4 oz (55.52 kg)  LMP 11/16/2011  Alert and in no distress. Right TMJ without erythema, no asymmetry, non tender, no popping on opening and closing. Left TMJ with mild popping, non tender.  Pharyngeal area is normal. Neck is supple without adenopathy or thyromegaly. Cardiac exam shows a regular sinus rhythm without murmurs or gallops. Lungs are clear to auscultation.      Assessment & Plan:  H/O: GI bleed  Chronic jaw  pain  Discussed that getting her colonoscopy up to date and having only scar tissue as abnormal finding is a good thing. She will continue to keep stools soft by staying hydrated and eating adequate fiber. She will follow up with Dr. Benson Norway in 5 years and have a repeat colonoscopy in 10 years. Discussed trying ibuprofen before eating and see if this helps with pain. Also recommend that she contact her dentist who has evaluated her for this and who has recommended referral. She will call me if unable to get repeat referral from dentist to see oral surgery.  She has appointment with GYN in June and will have mammogram.  Follow up in July or August for CPE and fasting labs.  Will get records from Dr. Benson Norway regarding colonoscopy report.

## 2016-04-25 ENCOUNTER — Encounter: Payer: Self-pay | Admitting: Family Medicine

## 2016-04-26 ENCOUNTER — Encounter: Payer: Self-pay | Admitting: Family Medicine

## 2016-05-24 ENCOUNTER — Other Ambulatory Visit: Payer: Self-pay | Admitting: Obstetrics and Gynecology

## 2016-05-24 DIAGNOSIS — R928 Other abnormal and inconclusive findings on diagnostic imaging of breast: Secondary | ICD-10-CM

## 2016-05-31 ENCOUNTER — Ambulatory Visit
Admission: RE | Admit: 2016-05-31 | Discharge: 2016-05-31 | Disposition: A | Discharge status: Home / Self Care | Payer: BLUE CROSS/BLUE SHIELD | Source: Ambulatory Visit | Attending: Obstetrics and Gynecology | Admitting: Obstetrics and Gynecology

## 2016-05-31 DIAGNOSIS — R928 Other abnormal and inconclusive findings on diagnostic imaging of breast: Secondary | ICD-10-CM

## 2016-08-25 ENCOUNTER — Encounter (HOSPITAL_COMMUNITY): Payer: Self-pay | Admitting: Emergency Medicine

## 2016-08-25 ENCOUNTER — Emergency Department (HOSPITAL_COMMUNITY)
Admission: EM | Admit: 2016-08-25 | Discharge: 2016-08-25 | Disposition: A | Discharge status: Home / Self Care | Payer: BLUE CROSS/BLUE SHIELD | Attending: Emergency Medicine | Admitting: Emergency Medicine

## 2016-08-25 ENCOUNTER — Emergency Department (HOSPITAL_COMMUNITY): Payer: BLUE CROSS/BLUE SHIELD

## 2016-08-25 DIAGNOSIS — Y999 Unspecified external cause status: Secondary | ICD-10-CM | POA: Diagnosis not present

## 2016-08-25 DIAGNOSIS — S2241XA Multiple fractures of ribs, right side, initial encounter for closed fracture: Secondary | ICD-10-CM | POA: Diagnosis not present

## 2016-08-25 DIAGNOSIS — Y929 Unspecified place or not applicable: Secondary | ICD-10-CM | POA: Diagnosis not present

## 2016-08-25 DIAGNOSIS — S299XXA Unspecified injury of thorax, initial encounter: Secondary | ICD-10-CM | POA: Diagnosis present

## 2016-08-25 DIAGNOSIS — W1789XA Other fall from one level to another, initial encounter: Secondary | ICD-10-CM | POA: Insufficient documentation

## 2016-08-25 DIAGNOSIS — Z85048 Personal history of other malignant neoplasm of rectum, rectosigmoid junction, and anus: Secondary | ICD-10-CM | POA: Insufficient documentation

## 2016-08-25 DIAGNOSIS — Y939 Activity, unspecified: Secondary | ICD-10-CM | POA: Insufficient documentation

## 2016-08-25 MED ORDER — IBUPROFEN 400 MG PO TABS: 400.0000 mg | ORAL_TABLET | Freq: Four times a day (QID) | ORAL | 0 refills | Status: DC | PRN

## 2016-08-25 MED ORDER — HYDROCODONE-ACETAMINOPHEN 5-325 MG PO TABS
1.0000 | ORAL_TABLET | Freq: Once | ORAL | Status: AC
  Administered 2016-08-25: 1 via ORAL
  Filled 2016-08-25: qty 1

## 2016-08-25 MED ORDER — HYDROCODONE-ACETAMINOPHEN 5-325 MG PO TABS: 1.0000 | ORAL_TABLET | Freq: Four times a day (QID) | ORAL | 0 refills | Status: DC | PRN

## 2016-08-25 NOTE — Discharge Instructions (Addendum)
You have been diagnosed with broken ribs (rib 7th-8th).  Please use incentive spirometer 10 times hourly for the next 1 week to help keep your lung expanded and decrease risk of lung infection.  Take pain medication as needed.  Follow instruction below.  Return if you have any concerns or if your condition worsen, such as shortness of breath, or cough with bloody sputum.

## 2016-08-25 NOTE — ED Provider Notes (Signed)
[name] Provider Note   CSN: [id number] Arrival date & time: 08/25/16  1622  By signing my name below, I, Rebecca Bowers, attest that this documentation has been prepared under the direction and in the presence of Domenic Moras, PA-C.  Electronically Signed: Reola Bowers, ED Scribe. 08/25/16. 4:46 PM.  History   Chief Complaint Chief Complaint  Patient presents with  . Fall  . Rib Injury   The history is provided by the patient. No language interpreter was used.   HPI Comments: Rebecca Bowers is a 50 y.o. female who presents to the Emergency Department complaining of sudden onset, stabbing right-sided rib cage pain s/p ~1-76ft high fall that occurred just PTA. She rates her pain as 8/10 and denies radiation of her pain. Pt reports that she was standing on the edge of her bathtub when she suddenly fell forwards, landing on the right side of her chest which sustained her current pain. No LOC or head injury during the incident. Denies CP, light headedness, or dizziness prior to her fall. Pt took a dosage of Aleve just prior to coming into the ED with minimal relief of her current pain. Her pain is moderately relieved with sitting still. Her pain is exacerbated with torso movement and deep inspirations. Pt is not currently on anticoagulant or antiplatelet therapy. Denies any chance of being pregnant. NKDA. Denies abdominal pain, HA, SOB, or any other associated injuries/complaints.   Past Medical History:  Diagnosis Date  . Allergy   . 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 Chronic jaw pain 03/12/2016  . 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 ANUS SURGERY  05/09/2010   Excision of AIN-III right & left anterior  . CESAREAN SECTION     X2  . TUBAL LIGATION     BILATERAL   OB History      No data available     Home Medications    Prior to Admission medications   Medication Sig Start Date End Date Taking? Authorizing Provider  cetirizine-pseudoephedrine (ZYRTEC-D) 5-120 MG per tablet Take 1 tablet by mouth 2 (two) times daily.    Historical Provider, MD  estradiol (ESTRACE) 2 MG tablet  03/09/13   Historical Provider, MD  Multiple Vitamin (MULTIVITAMIN) capsule Take 1 capsule by mouth daily.      Historical Provider, MD  progesterone (PROMETRIUM) 100 MG capsule  03/09/13   Historical Provider, MD   Family History Family History  Problem Relation Age of Onset  . Cancer Father 94    lung  . Cancer Sister     lung   Social History Social History  Substance Use Topics  . Smoking status: Never Smoker  . Smokeless tobacco: Never Used  . Alcohol use 0.5 oz/week    1 Standard drinks or equivalent per week     Comment: 1 every 3-4 months   Allergies   Review of patient's allergies indicates no known allergies.  Review of Systems Review of Systems  Respiratory: Negative for shortness of breath.   Cardiovascular: Positive for chest pain (rib cage, right).  Gastrointestinal: Negative for abdominal pain.  Musculoskeletal: Positive for myalgias.  Neurological: Negative for dizziness, syncope, light-headedness and headaches.   Physical Exam Updated Vital Signs BP 135/80 (BP Location: Right Arm)   Pulse 76  Temp 98.2 F (36.8 C) (Oral)   Resp 16   Ht 4\' 11"  (1.499 m)   Wt 118 lb (53.5 kg)   LMP 11/16/2011   SpO2 100%   BMI 23.83 kg/m   Physical Exam  Constitutional: She appears well-developed and well-nourished.  HENT:  Head: Normocephalic.  Eyes: Conjunctivae are normal.  Cardiovascular: Normal rate, regular rhythm and normal heart sounds.   No murmur heard. Pulmonary/Chest: Breath sounds normal. No respiratory distress. She has no wheezes. She has no rales.  Abdominal: Soft. She exhibits no distension. There is no tenderness.  Musculoskeletal: Normal  range of motion. She exhibits tenderness.  TTP noted to the right lateral chest wall at level of T8 and T12. Area is w/o crepitus or emphysema.   Neurological: She is alert.  Skin: Skin is warm and dry.  Psychiatric: She has a normal mood and affect. Her behavior is normal.  Nursing note and vitals reviewed.  ED Treatments / Results  DIAGNOSTIC STUDIES: Oxygen Saturation is 100% on RA, normal by my interpretation.   COORDINATION OF CARE: 4:44 PM-Discussed next steps with pt. Pt verbalized understanding and is agreeable with the plan.   Radiology Dg Ribs Unilateral W/chest Right  Result Date: 08/25/2016 CLINICAL DATA:  Right lower posterior rib pain. EXAM: RIGHT RIBS AND CHEST - 3+ VIEW COMPARISON:  None. FINDINGS: Possible right anterolateral seventh rib fracture. Possible nondisplaced fracture of the costochondral junction of the anterior eighth rib. There is no evidence of pneumothorax or pleural effusion. Both lungs are clear. Heart size and mediastinal contours are within normal limits. IMPRESSION: 1. Possible right anterolateral seventh rib fracture. Possible nondisplaced fracture of the costochondral junction of the anterior eighth rib. Electronically Signed   By: Kathreen Devoid   On: 08/25/2016 17:16   Procedures Procedures (including critical care time)  Medications Ordered in ED Medications  HYDROcodone-acetaminophen (NORCO/VICODIN) 5-325 MG per tablet 1 tablet (1 tablet Oral Given 08/25/16 1649)    Initial Impression / Assessment and Plan / ED Course  I have reviewed the triage vital signs and the nursing notes.  Pertinent labs & imaging results that were available during my care of the patient were reviewed by me and considered in my medical decision making (see chart for details).  Clinical Course   Pt is a 50yo female who presents into the ED s/p mechanical fall from her ~1-56ft high bathtub edge. On examination, pt was found to be tender over the area at level with  T10-T12, but otherwise normal. Pertinent imaging performed revealed possible right anterolateral seventh rib fracture and possible nondisplaced fracture of the costochondral junction of the anterior eighth rib. Pain was treated with Vicodin 5-325mg  with complete relief of pain while in the ED. Pt will be d/c with rx's for continued at home pain medications and incentive spirometer. Explained to the pt that pain is to be controlled symptomatically at home w/ rx'd pain medications/rest and not engaging in exertional activities until her pain improves. Discussed f/u w/ PCP if pain does not improve, and return precautions if her symptoms suddenly worsen. Pt is comfortable w/ plan and stable for d/c. She has no other complaints at this time and all questions were answered prior to disposition. Definitive rib fracture care were discussed with pt.    Final Clinical Impressions(s) / ED Diagnoses   Final diagnoses:  Multiple fractures of ribs, right side, initial encounter for closed fracture   New Prescriptions New Prescriptions   HYDROCODONE-ACETAMINOPHEN (NORCO/VICODIN) 5-325 MG TABLET  Take 1 tablet by mouth every 6 (six) hours as needed for severe pain.   IBUPROFEN (ADVIL,MOTRIN) 400 MG TABLET    Take 1 tablet (400 mg total) by mouth every 6 (six) hours as needed.   I personally performed the services described in this documentation, which was scribed in my presence. The recorded information has been reviewed and is accurate.       Domenic Moras, PA-C 08/25/16 [place], PA-C 08/25/16 [place], DO 08/25/16 2033

## 2016-08-25 NOTE — ED Triage Notes (Addendum)
Mechanical fall while cleaning shower (slipped). Landed on right side on tub edge. Pain in right side. Painful to take a deep breath. No SOB, no difficulty breathing.

## 2016-08-25 NOTE — ED Notes (Signed)
Declined W/C at D/C and was escorted to lobby by RN. 

## 2016-08-28 ENCOUNTER — Encounter: Payer: Self-pay | Admitting: Medical

## 2016-08-28 ENCOUNTER — Ambulatory Visit (INDEPENDENT_AMBULATORY_CARE_PROVIDER_SITE_OTHER): Payer: BLUE CROSS/BLUE SHIELD | Admitting: Medical

## 2016-08-28 VITALS — BP 116/70 | HR 64 | Ht 59.0 in | Wt 125.0 lb

## 2016-08-28 DIAGNOSIS — S2241XD Multiple fractures of ribs, right side, subsequent encounter for fracture with routine healing: Secondary | ICD-10-CM

## 2016-08-28 DIAGNOSIS — S20211D Contusion of right front wall of thorax, subsequent encounter: Secondary | ICD-10-CM | POA: Diagnosis not present

## 2016-08-28 DIAGNOSIS — W19XXXD Unspecified fall, subsequent encounter: Secondary | ICD-10-CM

## 2016-08-28 MED ORDER — HYDROCODONE-ACETAMINOPHEN 5-325 MG PO TABS: 1.0000 | ORAL_TABLET | Freq: Four times a day (QID) | ORAL | 0 refills | Status: DC | PRN

## 2016-08-28 NOTE — Progress Notes (Signed)
Subjective: Chief Complaint  Patient presents with  . Follow-up    seen in the ED on 08/25/16 for Multiple fractures of ribs, right side,   Here for ED f/u.  She is accompanied by her husband today.  Was seen in the ED on 08/25/16 for rib fracture, fall. She had been cleaning her shower on 08/25/16 was standing on edge of shower, slipped and fell hitting left chest and hip against tub.  Had immediate pain.  Went to the ED later that evening.   Had xrays shows rib fractures, and given ibuprofen and hydrocodone.  She forgot to discus work and restriction with the doctor.  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  Was also told by the ED to do incentive spirometry.  No other aggravating or relieving factors. No other complaint.  Past Medical History:  Diagnosis Date  . Allergy   . 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 cetirizine-pseudoephedrine (ZYRTEC-D) 5-120 MG per tablet Take 1 tablet by mouth 2 (two) times daily.    Marland Kitchen estradiol (ESTRACE) 2 MG tablet     . ibuprofen (ADVIL,MOTRIN) 400 MG tablet Take 1 tablet (400 mg total) by mouth every 6 (six) hours as needed. 30 tablet 0  . Multiple Vitamin (MULTIVITAMIN) capsule Take 1 capsule by mouth daily.      . progesterone (PROMETRIUM) 100 MG capsule      No current facility-administered medications on file prior to visit.    ROS as in subjective   Objective: BP 116/70   Pulse 64   Ht 4\' 11"  (1.499 m)   Wt 125 lb (56.7 kg)   LMP 11/16/2011   SpO2 98%   BMI 25.25 kg/m   General appearance: alert, no distress, WD/WN Quite tender over right lateral chest wall and anterior chest wall inferiorly from mid breast to about 9th rib, otherwise non tender She reports pain with inspiration Neck: supple, no lymphadenopathy, no thyromegaly, no masses Heart:  RRR, normal S1, S2, no murmurs Lungs: CTA bilaterally, no wheezes, rhonchi, or rales Abdomen: +bs, soft, non tender, non distended, no masses, no hepatomegaly, no splenomegaly Pulses: 2+ symmetric, upper and lower extremities, normal cap refill Ext: no edema Legs: right lateral hip and upper thigh tender, there is some purplish bruising of right lateral and anterior upper thigh Otherwise legs and back non tender, normal ROM of legs without pain  Assessment: Encounter Diagnoses  Name Primary?  . Closed fracture of multiple ribs of right side with routine healing, subsequent encounter Yes  . Rib contusion, right, subsequent encounter   . Fall, subsequent encounter      Plan: Reviewed ED report, xray of ribs, and discussed exam, symptoms and findings.  reviewed following recommendations.  Gave note for work restrictions x 1 week.  Patient Instructions  Recommendations  Continue the Ibuprofen 400mg  tablets, but take 2 at a time or 800mg  twice daily this week  Then after this week take 1 tablet twice for pain and inflammation  After 2 weeks, take Ibuprofen as needed for pain and inflammation  Use the Norco/hydrocodone as needed for worse pain.  For example, you can use this 2-3 times daily for a few days, then twice daily for a week, then just as needed  Expect it to take 3-4  weeks to feel much better  It may take 6-8 weeks for the ribs to heal completely   Sherren was seen today for follow-up.  Diagnoses and all orders for this visit:  Closed fracture of multiple ribs of right side with routine healing, subsequent encounter  Rib contusion, right, subsequent encounter  Fall, subsequent encounter  Other orders -     HYDROcodone-acetaminophen (NORCO/VICODIN) 5-325 MG tablet; Take 1 tablet by mouth every 6 (six) hours as needed for severe pain.

## 2016-08-28 NOTE — Patient Instructions (Signed)
Recommendations  Continue the Ibuprofen 400mg  tablets, but take 2 at a time or 800mg  twice daily this week  Then after this week take 1 tablet twice for pain and inflammation  After 2 weeks, take Ibuprofen as needed for pain and inflammation  Use the Norco/hydrocodone as needed for worse pain.  For example, you can use this 2-3 times daily for a few days, then twice daily for a week, then just as needed  Expect it to take 3-4 weeks to feel much better  It may take 6-8 weeks for the ribs to heal completely

## 2016-09-03 ENCOUNTER — Other Ambulatory Visit: Payer: Self-pay | Admitting: Medical

## 2016-09-03 ENCOUNTER — Telehealth: Payer: Self-pay | Admitting: Medical

## 2016-09-03 MED ORDER — IBUPROFEN 600 MG PO TABS: 600.0000 mg | ORAL_TABLET | Freq: Two times a day (BID) | ORAL | 0 refills | Status: DC

## 2016-09-03 NOTE — Telephone Encounter (Signed)
Informed pt that rx was sent to pharmacy

## 2016-09-03 NOTE — Telephone Encounter (Signed)
Pt called and was wanting a refill on ibuprofen, she has some of the other pain medicine but doesn't take that while she is at work, pt uses [id number] - [place], [place] - [address] pt can be reached at [phone]

## 2016-09-03 NOTE — Telephone Encounter (Signed)
I sent Ibuprofen 600mg  BID.

## 2016-09-19 ENCOUNTER — Other Ambulatory Visit: Payer: Self-pay | Admitting: Medical

## 2016-10-23 ENCOUNTER — Other Ambulatory Visit: Payer: Self-pay | Admitting: Obstetrics and Gynecology

## 2016-10-23 DIAGNOSIS — Z1231 Encounter for screening mammogram for malignant neoplasm of breast: Secondary | ICD-10-CM

## 2016-10-23 DIAGNOSIS — R921 Mammographic calcification found on diagnostic imaging of breast: Secondary | ICD-10-CM

## 2016-11-28 ENCOUNTER — Ambulatory Visit
Admission: RE | Admit: 2016-11-28 | Discharge: 2016-11-28 | Disposition: A | Discharge status: Home / Self Care | Payer: BLUE CROSS/BLUE SHIELD | Source: Ambulatory Visit | Attending: Obstetrics and Gynecology | Admitting: Obstetrics and Gynecology

## 2016-11-28 ENCOUNTER — Ambulatory Visit: Payer: BLUE CROSS/BLUE SHIELD

## 2016-11-28 DIAGNOSIS — R921 Mammographic calcification found on diagnostic imaging of breast: Secondary | ICD-10-CM

## 2017-05-31 ENCOUNTER — Other Ambulatory Visit: Payer: Self-pay | Admitting: Obstetrics and Gynecology

## 2017-05-31 DIAGNOSIS — R921 Mammographic calcification found on diagnostic imaging of breast: Secondary | ICD-10-CM

## 2017-06-05 ENCOUNTER — Ambulatory Visit
Admission: RE | Admit: 2017-06-05 | Discharge: 2017-06-05 | Disposition: A | Discharge status: Home / Self Care | Payer: BLUE CROSS/BLUE SHIELD | Source: Ambulatory Visit | Attending: Obstetrics and Gynecology | Admitting: Obstetrics and Gynecology

## 2017-06-05 DIAGNOSIS — R921 Mammographic calcification found on diagnostic imaging of breast: Secondary | ICD-10-CM

## 2017-08-21 DIAGNOSIS — Z13820 Encounter for screening for osteoporosis: Secondary | ICD-10-CM | POA: Diagnosis not present

## 2017-08-21 DIAGNOSIS — Z6825 Body mass index (BMI) 25.0-25.9, adult: Secondary | ICD-10-CM | POA: Diagnosis not present

## 2017-08-21 DIAGNOSIS — Z01419 Encounter for gynecological examination (general) (routine) without abnormal findings: Secondary | ICD-10-CM | POA: Diagnosis not present

## 2018-05-15 ENCOUNTER — Other Ambulatory Visit: Payer: Self-pay | Admitting: Obstetrics and Gynecology

## 2018-05-15 DIAGNOSIS — R921 Mammographic calcification found on diagnostic imaging of breast: Secondary | ICD-10-CM

## 2018-06-09 ENCOUNTER — Other Ambulatory Visit: Payer: Self-pay | Admitting: Obstetrics and Gynecology

## 2018-06-09 ENCOUNTER — Ambulatory Visit
Admission: RE | Admit: 2018-06-09 | Discharge: 2018-06-09 | Disposition: A | Discharge status: Home / Self Care | Payer: BLUE CROSS/BLUE SHIELD | Source: Ambulatory Visit | Attending: Obstetrics and Gynecology | Admitting: Obstetrics and Gynecology

## 2018-06-09 DIAGNOSIS — R921 Mammographic calcification found on diagnostic imaging of breast: Secondary | ICD-10-CM

## 2018-06-11 ENCOUNTER — Ambulatory Visit
Admission: RE | Admit: 2018-06-11 | Discharge: 2018-06-11 | Disposition: A | Discharge status: Home / Self Care | Payer: BLUE CROSS/BLUE SHIELD | Source: Ambulatory Visit | Attending: Obstetrics and Gynecology | Admitting: Obstetrics and Gynecology

## 2018-06-11 DIAGNOSIS — R921 Mammographic calcification found on diagnostic imaging of breast: Secondary | ICD-10-CM | POA: Diagnosis not present

## 2018-06-11 DIAGNOSIS — N6011 Diffuse cystic mastopathy of right breast: Secondary | ICD-10-CM | POA: Diagnosis not present

## 2018-09-15 DIAGNOSIS — Z6824 Body mass index (BMI) 24.0-24.9, adult: Secondary | ICD-10-CM | POA: Diagnosis not present

## 2018-09-15 DIAGNOSIS — Z801 Family history of malignant neoplasm of trachea, bronchus and lung: Secondary | ICD-10-CM | POA: Diagnosis not present

## 2018-09-15 DIAGNOSIS — Z803 Family history of malignant neoplasm of breast: Secondary | ICD-10-CM | POA: Diagnosis not present

## 2018-09-15 DIAGNOSIS — Z01419 Encounter for gynecological examination (general) (routine) without abnormal findings: Secondary | ICD-10-CM | POA: Diagnosis not present

## 2018-10-30 DIAGNOSIS — Z09 Encounter for follow-up examination after completed treatment for conditions other than malignant neoplasm: Secondary | ICD-10-CM | POA: Diagnosis not present

## 2018-12-27 DIAGNOSIS — J340 Abscess, furuncle and carbuncle of nose: Secondary | ICD-10-CM | POA: Diagnosis not present

## 2019-01-07 ENCOUNTER — Encounter: Payer: Self-pay | Admitting: Family Medicine

## 2019-01-07 ENCOUNTER — Ambulatory Visit: Payer: BLUE CROSS/BLUE SHIELD | Admitting: Family Medicine

## 2019-01-07 VITALS — BP 124/80 | HR 91 | Temp 98.6°F | Resp 16 | Wt 123.2 lb

## 2019-01-07 DIAGNOSIS — T3695XA Adverse effect of unspecified systemic antibiotic, initial encounter: Principal | ICD-10-CM

## 2019-01-07 DIAGNOSIS — L270 Generalized skin eruption due to drugs and medicaments taken internally: Secondary | ICD-10-CM | POA: Diagnosis not present

## 2019-01-07 NOTE — Patient Instructions (Signed)
Stop the antibiotic and do not take this in the future.   Go home and take Benadryl as discussed.   Take cool showers or baths. You can use cold compresses to help control itching.   Follow up if needed but if you notice any tongue swelling or swelling of your throat, difficulty breathing or swallowing then you should call 911 or go to the emergency department.     Drug Allergy A drug allergy is when your body reacts in a bad way to a medicine. The reaction may be mild or very bad. In some cases, it can be life-threatening. If you have an allergic reaction, get help right away. You should get help even if the reaction seems mild. What are the causes? This condition is caused by a reaction in your body's defense system (immune system). The system sees a medicine as being harmful when it is not. What are the signs or symptoms? Symptoms of a mild reaction  A stuffy nose (nasal congestion).  Tingling in your mouth.  An itchy, red rash. Symptoms of a very bad reaction  Swelling of your eyes, lips, face, or tongue.  Swelling of the back of your mouth and your throat.  Breathing loudly (wheezing).  A hoarse voice.  Itchy, red, swollen areas of skin (hives).  Feeling dizzy or light-headed.  Passing out (fainting).  Feeling worried or nervous (anxiety).  Feeling mixed up (confused).  Pain in your belly (abdomen).  Trouble with breathing, talking, or swallowing.  A tight feeling in your chest.  Fast or uneven heartbeats (palpitations).  Throwing up (vomiting).  Watery poop (diarrhea). How is this treated? There is no cure for allergies. An allergic reaction can be treated with:  Medicines to help your symptoms.  Medicines that you breathe into your lungs (respiratory inhalers).  An allergy shot (epinephrine injection). For a very bad reaction, you may need to stay in the hospital. Your doctor may teach you how to use an allergy kit (anaphylaxis kit) and how to give  yourself an allergy shot. You can give yourself an allergy shot with what is called an auto-injector "pen." Follow these instructions at home: If you have a very bad allergy:   Always keep an auto-injector pen or your allergy kit with you. These could save your life. Use them as told by your doctor.  Make sure that you, the people who live with you, and your employer know: ? How to use your allergy kit. ? How to use an auto-injector pen to give you an allergy shot.  If you used your auto-injector pen: ? Get more medicine for it right away. This is important in case you have another reaction. ? Get help right away.  Wear a medical alert bracelet or necklace that says you have an allergy, if your doctor tells you to do this. General instructions  Avoid medicines that you are allergic to.  Take over-the-counter and prescription medicines only as told by your doctor.  Do not drive until your doctor says it is safe.  If you have itchy, red, swollen areas of skin or a rash: ? Use an over-the-counter medicine (antihistamine) as told by your doctor. ? Put cold, wet cloths (cold compresses) on your skin. ? Take baths or showers in cool water. Avoid hot water.  If you had tests done, it is up to you to get your test results. Ask your doctor when your results will be ready.  Tell any doctors who care for you  that you have a drug allergy.  Keep all follow-up visits as told by your doctor. This is important. Contact a doctor if:  You think that you are having a mild allergic reaction.  You have symptoms that last more than 2 days after your reaction.  Your symptoms get worse.  You get new symptoms. Get help right away if:  You had to use your auto-injector pen. You must go to the emergency room, even if the medicine seems to be working.  You have symptoms of a very bad allergic reaction. These symptoms may be an emergency. Do not wait to see if the symptoms will go away. Use your  auto-injector pen or allergy kit as you have been told. Get medical help right away. Call your local emergency services (911 in the [place] Do not drive yourself to the hospital. Summary  A drug allergy is when your body reacts in a bad way to a medicine.  Take medicines only as told by your doctor.  Tell any doctors who care for you that you have a drug allergy.  Always keep an auto-injector pen or your allergy kit with you if you have a very bad allergy. This information is not intended to replace advice given to you by your health care provider. Make sure you discuss any questions you have with your health care provider. Document Released: 12/20/2004 Document Revised: 05/28/2018 Document Reviewed: 05/28/2018 Elsevier Interactive Patient Education  2019 [employer].

## 2019-01-07 NOTE — Progress Notes (Signed)
   Subjective:    Patient ID: Rebecca Bowers, female    DOB: 1966/04/26, 53 y.o.   MRN: [id number]  HPI Chief Complaint  Patient presents with  . possible reaction    12/27/18 got rx of antbiotic and almost done with rx and having reaction on arm.legs and ears burning   She is here with complaints of a 24 hour history of a red rash on her bilateral upper posterior arms and bilateral thighs. She also noticed her tongue is now tingling and ears burning. States the rash is hot and is burning. She thinks it may be spreading to her chest, abdomen and back now.  Denies fever, chills, dizziness, sore throat, dysphagia, chest pain, palpitations, shortness of breath, cough, abdominal pain, N/V/D.  She has not taken anything for her symptoms.   Denies any new soaps, lotions, detergents or clothing.  Stomach virus last week with N/V/D and this resolved.   She has been taking Bactrim since 12/27/2018 for a nasal infection. This was prescribed at an urgent care and she was supposed to take it for 14 days. Her last dose was last night.    Reviewed allergies, medications, past medical, surgical, family, and social history.    Review of Systems Pertinent positives and negatives in the history of present illness.     Objective:   Physical Exam BP 124/80   Pulse 91   Temp 98.6 F (37 C) (Oral)   Resp 16   Wt 123 lb 3.2 oz (55.9 kg)   LMP 11/16/2011   SpO2 98%   BMI 24.88 kg/m   Alert and oriented and in no acute distress.  Normal tongue and oropharyngeal exam.  Neck is supple, no adenopathy, normal range of motion. Red maculopapular rash on her bilateral posterior upper extremities, chest, back and abdomen.  No sign of infection.  Heart with a regular rate and rhythm.  Lungs clear to auscultation, no wheezes.  Normal work of breathing.  No rash on the palms or soles.      Assessment & Plan:  Antibiotic-induced allergic rash  She is not in any acute distress. She appears to be having  allergic reaction to the Bactrim.  Currently she has a diffuse red pruritic rash.  No sign of pharyngeal edema or difficulty breathing.  She will start Benadryl when she arrives home and use cool compresses and cool baths to help control itching.  She is aware that if she does develop tongue or throat swelling or difficulty breathing or swallowing that she should call 911 or go to the ED.

## 2019-05-06 DIAGNOSIS — M7022 Olecranon bursitis, left elbow: Secondary | ICD-10-CM | POA: Diagnosis not present

## 2019-05-06 DIAGNOSIS — M25522 Pain in left elbow: Secondary | ICD-10-CM | POA: Diagnosis not present

## 2019-09-17 DIAGNOSIS — M2669 Other specified disorders of temporomandibular joint: Secondary | ICD-10-CM | POA: Diagnosis not present

## 2019-09-17 DIAGNOSIS — R6884 Jaw pain: Secondary | ICD-10-CM | POA: Diagnosis not present

## 2019-09-28 DIAGNOSIS — R6884 Jaw pain: Secondary | ICD-10-CM | POA: Diagnosis not present

## 2019-09-30 DIAGNOSIS — Z1231 Encounter for screening mammogram for malignant neoplasm of breast: Secondary | ICD-10-CM | POA: Diagnosis not present

## 2019-09-30 DIAGNOSIS — Z13820 Encounter for screening for osteoporosis: Secondary | ICD-10-CM | POA: Diagnosis not present

## 2019-10-02 DIAGNOSIS — Z6825 Body mass index (BMI) 25.0-25.9, adult: Secondary | ICD-10-CM | POA: Diagnosis not present

## 2019-10-02 DIAGNOSIS — Z01419 Encounter for gynecological examination (general) (routine) without abnormal findings: Secondary | ICD-10-CM | POA: Diagnosis not present

## 2019-10-07 DIAGNOSIS — R6884 Jaw pain: Secondary | ICD-10-CM | POA: Diagnosis not present

## 2019-10-14 DIAGNOSIS — R6884 Jaw pain: Secondary | ICD-10-CM | POA: Diagnosis not present

## 2019-10-18 DIAGNOSIS — R519 Headache, unspecified: Secondary | ICD-10-CM | POA: Diagnosis not present

## 2019-10-18 DIAGNOSIS — Z20828 Contact with and (suspected) exposure to other viral communicable diseases: Secondary | ICD-10-CM | POA: Diagnosis not present

## 2019-10-28 DIAGNOSIS — R6884 Jaw pain: Secondary | ICD-10-CM | POA: Diagnosis not present

## 2019-11-05 DIAGNOSIS — R6884 Jaw pain: Secondary | ICD-10-CM | POA: Diagnosis not present

## 2019-11-12 DIAGNOSIS — R6884 Jaw pain: Secondary | ICD-10-CM | POA: Diagnosis not present

## 2019-11-28 DIAGNOSIS — R05 Cough: Secondary | ICD-10-CM | POA: Diagnosis not present

## 2019-11-28 DIAGNOSIS — Z20828 Contact with and (suspected) exposure to other viral communicable diseases: Secondary | ICD-10-CM | POA: Diagnosis not present

## 2019-12-08 IMAGING — MG STEREOTACTIC VACUUM ASSIST RIGHT
8 of 9 series · 8 of 13 positions shown · non-contrast
Comparison: Previous exams.

ADDENDUM:
Pathology revealed FIBROCYSTIC CHANGES WITH CALCIFICATIONS of the
Right breast, lower outer quadrant at middle depth. This was found
to be concordant by Dr. Midoc Uw.

Pathology results were discussed with the patient by telephone. The
patient reported doing well after the biopsy with tenderness at the
site. Post biopsy instructions and care were reviewed and questions
were answered. The patient was encouraged to call The [REDACTED]
The patient was instructed to return for annual screening
Pathology results reported by Bardo Gui, RN on 06/12/2018.
CLINICAL DATA: 7 mm group of indeterminate calcifications involving
the LOWER OUTER QUADRANT of the RIGHT breast at MIDDLE depth.
EXAM:
RIGHT BREAST STEREOTACTIC CORE NEEDLE BIOPSY

[R (1 of 3)]
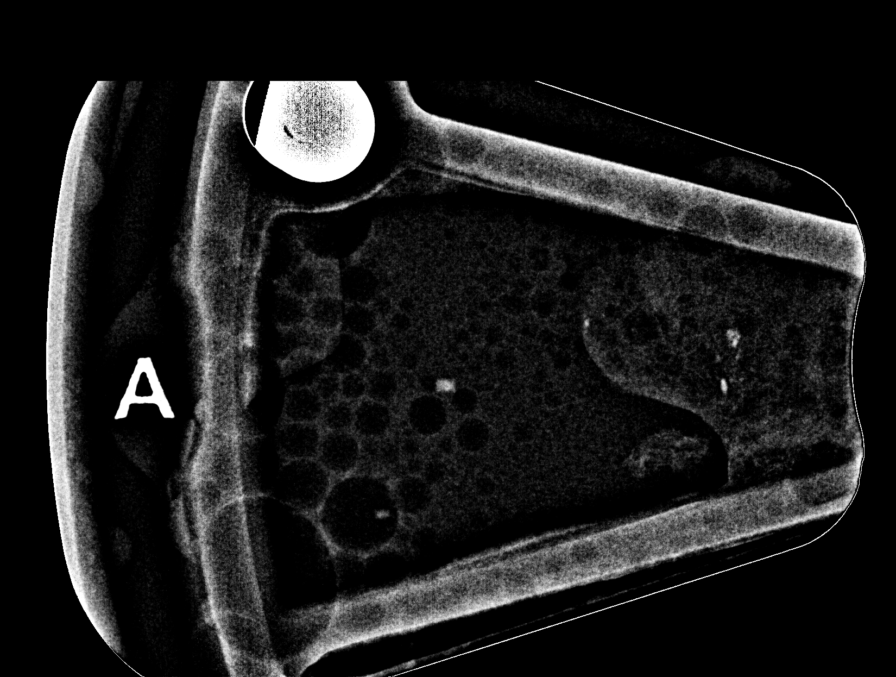

[R (2 of 3)]
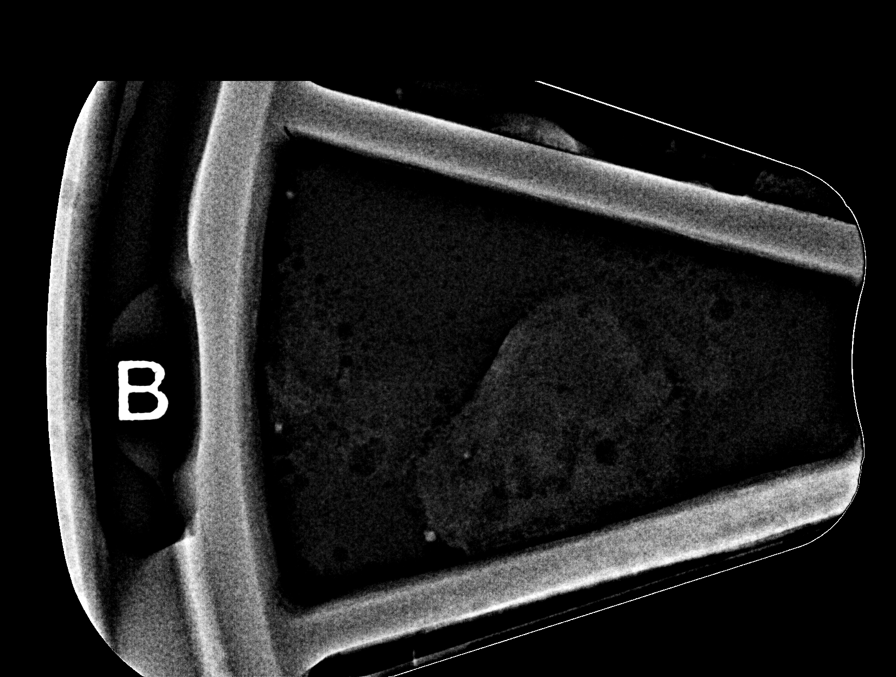

[R (3 of 3)]
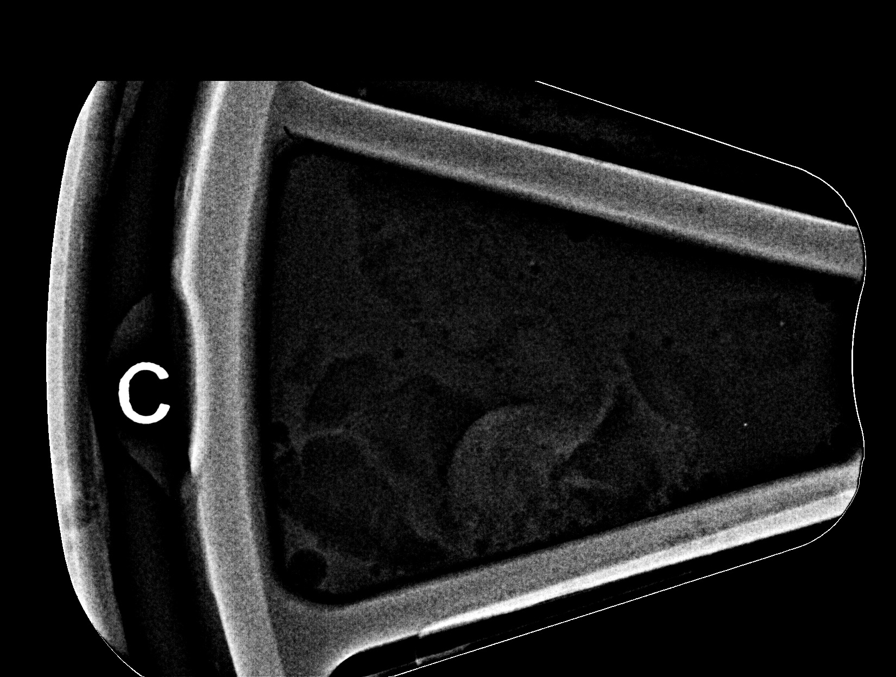

[R LM (1 of 5)]
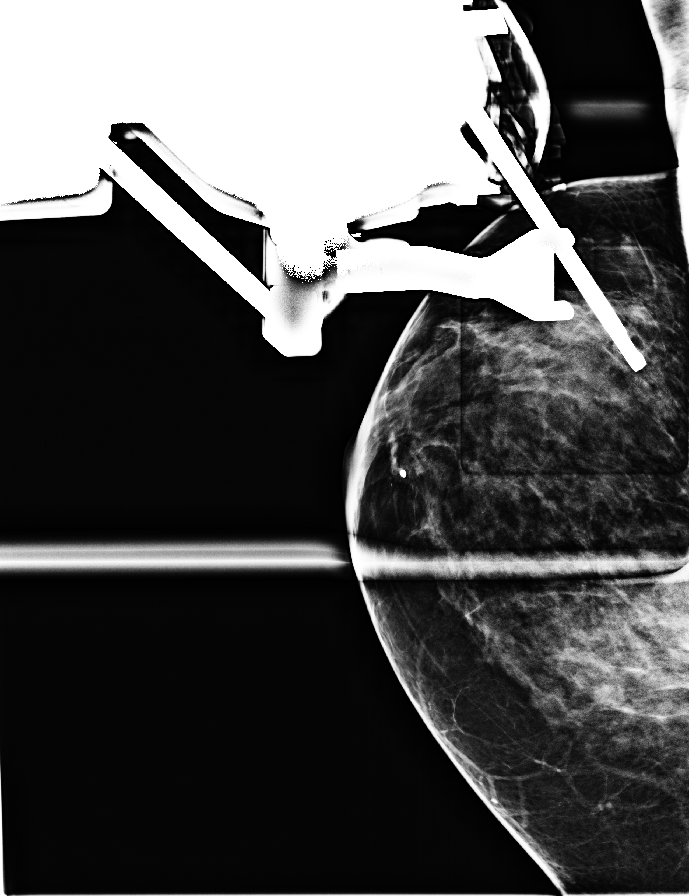

[R LM (2 of 5)]
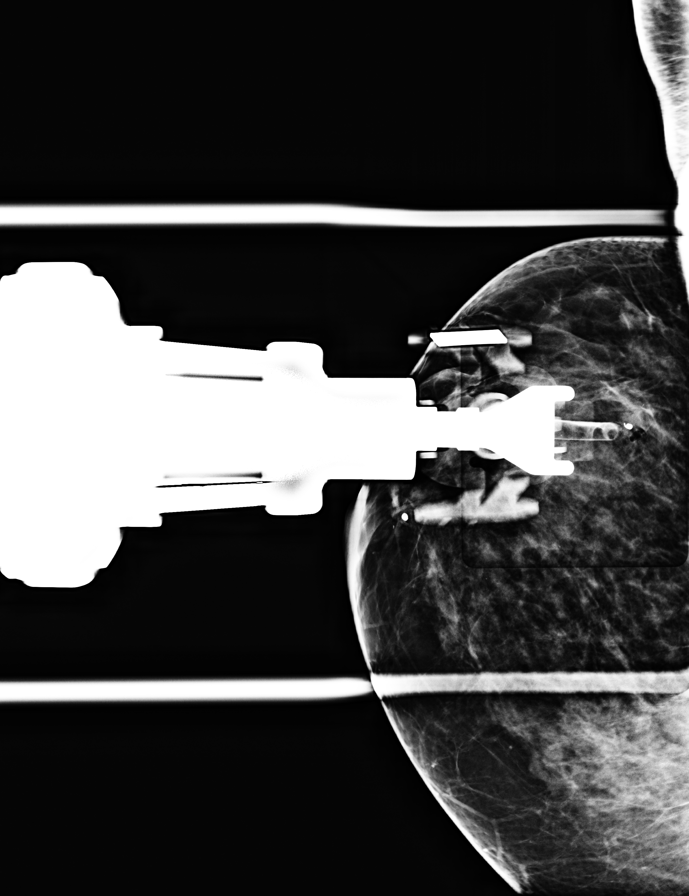

[R LM (3 of 5)]
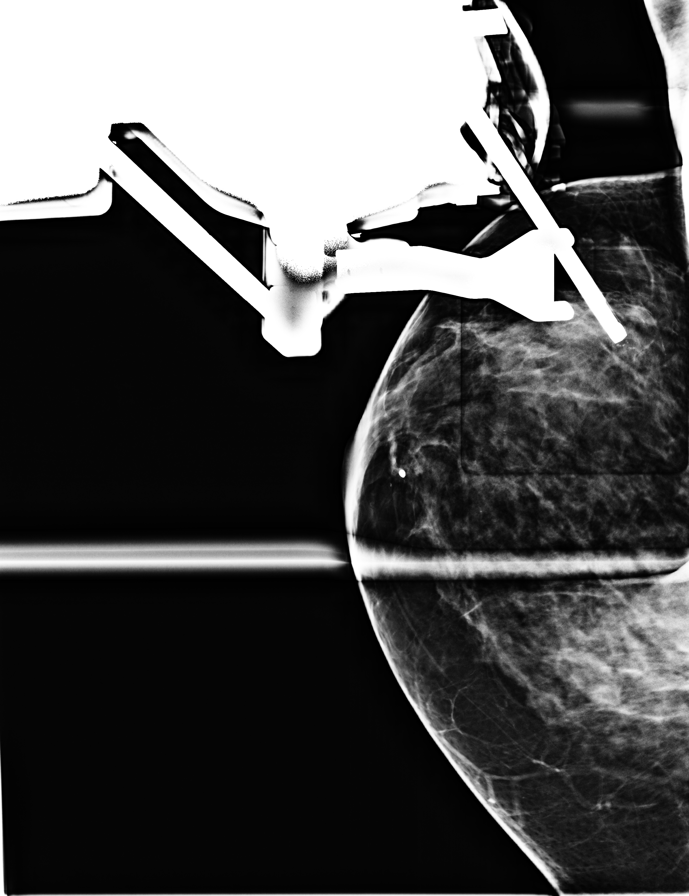

[R LM (4 of 5)]
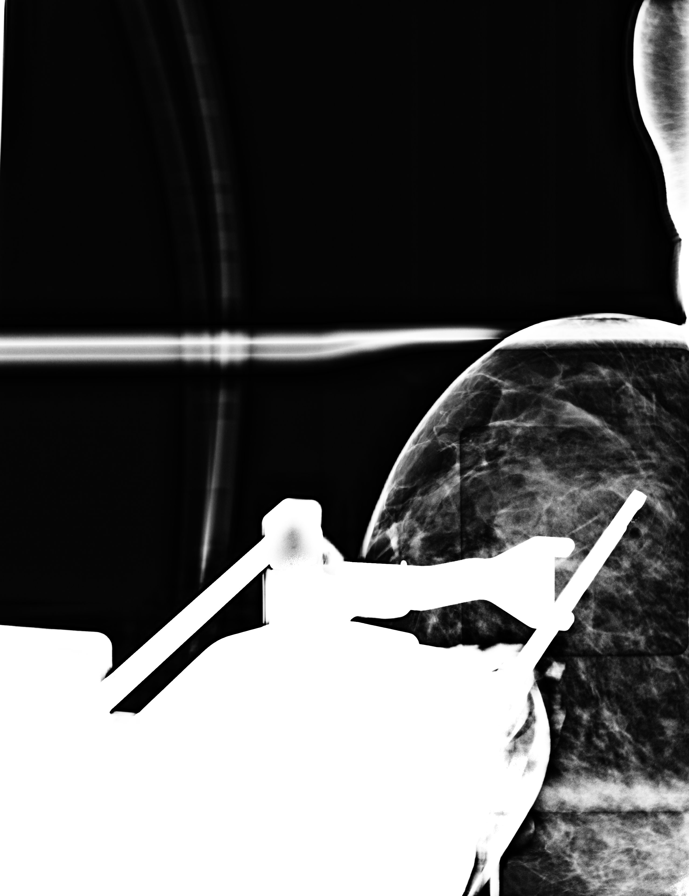

[R LM (5 of 5)]
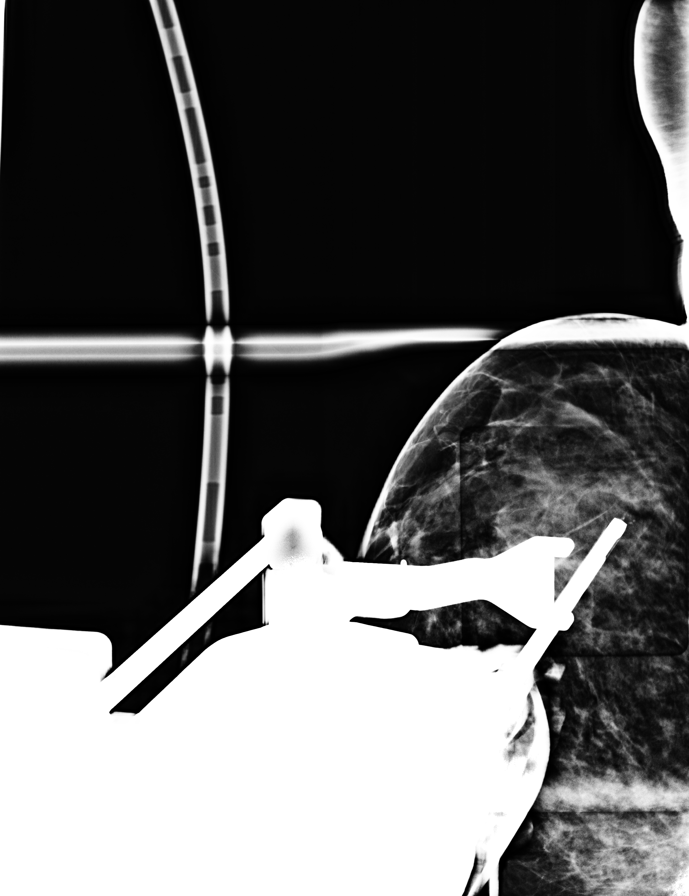

[8 of 13 positions shown; findings below may reference images not displayed]



Using sterile technique with chlorhexidine as skin antisepsis, 1%
lidocaine and 1% lidocaine with epinephrine as local anesthetic,
under stereotactic guidance, a 9 gauge Brevera vacuum assisted
device was used to perform core needle biopsy of calcifications
involving the LOWER OUTER QUADRANT of the RIGHT breast at MIDDLE
depth using a LATERAL approach. Specimen radiograph was performed
showing calcifications in all 3 core samples. Specimens with
calcifications are identified for pathology.

Lesion quadrant: LOWER OUTER QUADRANT.

At the conclusion of the procedure, a coil shaped tissue marker clip
was deployed into the biopsy cavity. Follow-up 2-view mammogram was
performed and dictated separately.
IMPRESSION: Stereotactic-guided biopsy of indeterminate calcifications involving
the LOWER OUTER QUADRANT of the RIGHT breast. No apparent
complications.

## 2019-12-16 DIAGNOSIS — R6884 Jaw pain: Secondary | ICD-10-CM | POA: Diagnosis not present

## 2019-12-25 DIAGNOSIS — R6884 Jaw pain: Secondary | ICD-10-CM | POA: Diagnosis not present

## 2020-06-23 DIAGNOSIS — R05 Cough: Secondary | ICD-10-CM | POA: Diagnosis not present

## 2020-06-23 DIAGNOSIS — J019 Acute sinusitis, unspecified: Secondary | ICD-10-CM | POA: Diagnosis not present

## 2020-06-23 DIAGNOSIS — B9689 Other specified bacterial agents as the cause of diseases classified elsewhere: Secondary | ICD-10-CM | POA: Diagnosis not present

## 2020-10-24 DIAGNOSIS — Z1231 Encounter for screening mammogram for malignant neoplasm of breast: Secondary | ICD-10-CM | POA: Diagnosis not present

## 2020-10-24 DIAGNOSIS — Z01419 Encounter for gynecological examination (general) (routine) without abnormal findings: Secondary | ICD-10-CM | POA: Diagnosis not present

## 2020-10-24 DIAGNOSIS — Z6825 Body mass index (BMI) 25.0-25.9, adult: Secondary | ICD-10-CM | POA: Diagnosis not present

## 2020-11-22 ENCOUNTER — Telehealth (INDEPENDENT_AMBULATORY_CARE_PROVIDER_SITE_OTHER): Payer: BC Managed Care – PPO | Admitting: Medical

## 2020-11-22 ENCOUNTER — Other Ambulatory Visit (INDEPENDENT_AMBULATORY_CARE_PROVIDER_SITE_OTHER): Payer: BC Managed Care – PPO

## 2020-11-22 ENCOUNTER — Encounter: Payer: Self-pay | Admitting: Medical

## 2020-11-22 VITALS — Wt 120.0 lb

## 2020-11-22 DIAGNOSIS — R195 Other fecal abnormalities: Secondary | ICD-10-CM

## 2020-11-22 DIAGNOSIS — R059 Cough, unspecified: Secondary | ICD-10-CM

## 2020-11-22 DIAGNOSIS — R110 Nausea: Secondary | ICD-10-CM | POA: Diagnosis not present

## 2020-11-22 DIAGNOSIS — Z20822 Contact with and (suspected) exposure to covid-19: Secondary | ICD-10-CM

## 2020-11-22 LAB — POCT INFLUENZA A/B
Influenza A, POC: NEGATIVE
Influenza B, POC: NEGATIVE

## 2020-11-22 LAB — POC COVID19 BINAXNOW: SARS Coronavirus 2 Ag: NEGATIVE

## 2020-11-22 MED ORDER — EMERGEN-C IMMUNE PLUS PO PACK: 1.0000 | PACK | Freq: Two times a day (BID) | ORAL | 0 refills | Status: AC

## 2020-11-22 MED ORDER — PROMETHAZINE-DM 6.25-15 MG/5ML PO SYRP: 5.0000 mL | ORAL_SOLUTION | Freq: Four times a day (QID) | ORAL | 0 refills | Status: DC | PRN

## 2020-11-22 NOTE — Progress Notes (Signed)
Subjective:     Patient ID: Rebecca Bowers, female   DOB: 16-Nov-1966, 54 y.o.   MRN: [id number]  This visit type was conducted due to national recommendations for restrictions regarding the COVID-19 Pandemic (e.g. social distancing) in an effort to limit this patient's exposure and mitigate transmission in our community.  Due to their co-morbid illnesses, this patient is at least at moderate risk for complications without adequate follow up.  This format is felt to be most appropriate for this patient at this time.    Documentation for virtual audio and video telecommunications through [place] encounter:  The patient was located at home. The provider was located in the office. The patient did consent to this visit and is aware of possible charges through their insurance for this visit.  The other persons participating in this telemedicine service were none. Time spent on call was 20 minutes and in review of previous records 20 minutes total.  This virtual service is not related to other E/M service within previous 7 days.   HPI Chief Complaint  Patient presents with   Covid Exposure    Exposed to covid in household, headache, stomach pain and diarrhea- yesterday.    Virtual consult.   Has a positive contact in her household as of yesterday.   He had stuffy head and sinus drainage since Christmas eve.    He tested + yesterday.   She notes having headache since yesterday and diarrhea started today.  Feels nauseated.   Has a little cough.  Feels a little pressure in between shoulder blades.  No loss of smell of taste.   No fever.  Currently overall symptoms mild so far, 4/10.   Has had worse feeling with flu prior.  Hasn't taken anythign yet for symptoms .   Her granddaughter started having fever and headache this morning.  She is in the same household as well.     No other health issues, no obesity.   Nonsmoker.    Has not had the covid vaccine.  No other aggravating or relieving  factors. No other complaint.    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   Wt 120 lb (54.4 kg)    LMP 11/16/2011    BMI 24.24 kg/m  General: Well-developed well-nourished no acute distress No obvious wheezing or dyspnea Answers questions appropriately in complete sentences     Assessment:     Encounter Diagnoses  Name Primary?   Nausea Yes   Cough    Exposure to COVID-19 virus    Loose stools        Plan:     We discussed symptoms and concerns.  Given the exposure and the new symptoms likely Covid infection early.  Medications below sent to the pharmacy.  General recommendations: I recommend you rest, hydrate well with water and clear fluids throughout the day.   You can use Tylenol for pain or fever You can use over the counter Delsym for cough. You can use over the counter Emetrol for nausea.  Begin EmergenC immune plus over-the-counter vitamin pack  If you are having trouble breathing, if you are very weak, have high fever 103 or higher consistently  despite Tylenol, or uncontrollable nausea and vomiting, then call or go to the emergency department.    Covid symptoms such as fatigue and cough can linger over 2 weeks, even after the initial fever, aches, chills, and other initial symptoms.  We discussed quarantine measures for her and the other contacts in the household since several people were there in the same house some positive some not.  We discussed the new Covid quarantine guidelines that came out yesterday.  Advise she check the CDC website on quarantine for specifics    [name] was seen today for covid exposure.  Diagnoses and all orders for this visit:  Nausea -     POC COVID-19 BinaxNow;  Future -     Novel Coronavirus, NAA ([facility]); Future -     Influenza A/B; Future  Cough -     POC COVID-19 BinaxNow; Future -     Novel Coronavirus, NAA ([facility]); Future -     Influenza A/B; Future  Exposure to COVID-19 virus -     POC COVID-19 BinaxNow; Future -     Novel Coronavirus, NAA ([facility]); Future -     Influenza A/B; Future  Loose stools -     POC COVID-19 BinaxNow; Future -     Novel Coronavirus, NAA ([facility]); Future -     Influenza A/B; Future  Other orders -     promethazine-dextromethorphan (PROMETHAZINE-DM) 6.25-15 MG/5ML syrup; Take 5 mLs by mouth 4 (four) times daily as needed for cough. -     Multiple Vitamins-Minerals (EMERGEN-C IMMUNE PLUS) PACK; Take 1 tablet by mouth 2 (two) times daily.  Follow-up this afternoon in our back parking lot for testing as above

## 2020-11-23 LAB — SARS-COV-2, NAA 2 DAY TAT

## 2020-11-23 LAB — NOVEL CORONAVIRUS, NAA: SARS-CoV-2, NAA: NOT DETECTED

## 2020-11-30 ENCOUNTER — Telehealth: Payer: BLUE CROSS/BLUE SHIELD | Admitting: Medical

## 2020-12-03 DIAGNOSIS — H6693 Otitis media, unspecified, bilateral: Secondary | ICD-10-CM | POA: Diagnosis not present

## 2020-12-03 DIAGNOSIS — J019 Acute sinusitis, unspecified: Secondary | ICD-10-CM | POA: Diagnosis not present

## 2020-12-03 DIAGNOSIS — B9689 Other specified bacterial agents as the cause of diseases classified elsewhere: Secondary | ICD-10-CM | POA: Diagnosis not present

## 2021-02-13 DIAGNOSIS — H6691 Otitis media, unspecified, right ear: Secondary | ICD-10-CM | POA: Diagnosis not present

## 2021-02-13 DIAGNOSIS — H6983 Other specified disorders of Eustachian tube, bilateral: Secondary | ICD-10-CM | POA: Diagnosis not present

## 2021-02-13 DIAGNOSIS — B9689 Other specified bacterial agents as the cause of diseases classified elsewhere: Secondary | ICD-10-CM | POA: Diagnosis not present

## 2021-02-13 DIAGNOSIS — J019 Acute sinusitis, unspecified: Secondary | ICD-10-CM | POA: Diagnosis not present

## 2021-03-01 DIAGNOSIS — S93401A Sprain of unspecified ligament of right ankle, initial encounter: Secondary | ICD-10-CM | POA: Diagnosis not present

## 2021-03-01 DIAGNOSIS — S82891A Other fracture of right lower leg, initial encounter for closed fracture: Secondary | ICD-10-CM | POA: Diagnosis not present

## 2021-08-17 DIAGNOSIS — H6692 Otitis media, unspecified, left ear: Secondary | ICD-10-CM | POA: Diagnosis not present

## 2021-08-17 DIAGNOSIS — J029 Acute pharyngitis, unspecified: Secondary | ICD-10-CM | POA: Diagnosis not present

## 2021-08-17 DIAGNOSIS — R059 Cough, unspecified: Secondary | ICD-10-CM | POA: Diagnosis not present

## 2021-08-23 DIAGNOSIS — Z1211 Encounter for screening for malignant neoplasm of colon: Secondary | ICD-10-CM | POA: Diagnosis not present

## 2021-09-14 DIAGNOSIS — Z1211 Encounter for screening for malignant neoplasm of colon: Secondary | ICD-10-CM | POA: Diagnosis not present

## 2021-09-14 LAB — HM SIGMOIDOSCOPY

## 2021-10-03 ENCOUNTER — Encounter: Payer: Self-pay | Admitting: Internal Medicine

## 2021-11-13 DIAGNOSIS — Z6825 Body mass index (BMI) 25.0-25.9, adult: Secondary | ICD-10-CM | POA: Diagnosis not present

## 2021-11-13 DIAGNOSIS — Z13820 Encounter for screening for osteoporosis: Secondary | ICD-10-CM | POA: Diagnosis not present

## 2021-11-13 DIAGNOSIS — Z1231 Encounter for screening mammogram for malignant neoplasm of breast: Secondary | ICD-10-CM | POA: Diagnosis not present

## 2021-11-13 DIAGNOSIS — Z01419 Encounter for gynecological examination (general) (routine) without abnormal findings: Secondary | ICD-10-CM | POA: Diagnosis not present

## 2021-11-14 DIAGNOSIS — Z01419 Encounter for gynecological examination (general) (routine) without abnormal findings: Secondary | ICD-10-CM | POA: Diagnosis not present

## 2021-11-15 ENCOUNTER — Other Ambulatory Visit: Payer: Self-pay | Admitting: Obstetrics and Gynecology

## 2021-11-15 DIAGNOSIS — R928 Other abnormal and inconclusive findings on diagnostic imaging of breast: Secondary | ICD-10-CM

## 2021-12-12 ENCOUNTER — Ambulatory Visit
Admission: RE | Admit: 2021-12-12 | Discharge: 2021-12-12 | Disposition: A | Discharge status: Home / Self Care | Payer: BC Managed Care – PPO | Source: Ambulatory Visit | Attending: Obstetrics and Gynecology | Admitting: Obstetrics and Gynecology

## 2021-12-12 ENCOUNTER — Ambulatory Visit: Payer: BLUE CROSS/BLUE SHIELD

## 2021-12-12 DIAGNOSIS — R922 Inconclusive mammogram: Secondary | ICD-10-CM | POA: Diagnosis not present

## 2021-12-12 DIAGNOSIS — R928 Other abnormal and inconclusive findings on diagnostic imaging of breast: Secondary | ICD-10-CM

## 2021-12-26 ENCOUNTER — Other Ambulatory Visit: Payer: BLUE CROSS/BLUE SHIELD

## 2022-09-16 DIAGNOSIS — J019 Acute sinusitis, unspecified: Secondary | ICD-10-CM | POA: Diagnosis not present

## 2022-09-16 DIAGNOSIS — R030 Elevated blood-pressure reading, without diagnosis of hypertension: Secondary | ICD-10-CM | POA: Diagnosis not present

## 2022-09-17 DIAGNOSIS — N950 Postmenopausal bleeding: Secondary | ICD-10-CM | POA: Diagnosis not present

## 2022-09-17 DIAGNOSIS — D509 Iron deficiency anemia, unspecified: Secondary | ICD-10-CM | POA: Diagnosis not present

## 2022-10-01 DIAGNOSIS — U071 COVID-19: Secondary | ICD-10-CM | POA: Diagnosis not present

## 2022-10-16 DIAGNOSIS — J0191 Acute recurrent sinusitis, unspecified: Secondary | ICD-10-CM | POA: Diagnosis not present

## 2022-10-16 DIAGNOSIS — I10 Essential (primary) hypertension: Secondary | ICD-10-CM | POA: Diagnosis not present

## 2022-10-26 DIAGNOSIS — N950 Postmenopausal bleeding: Secondary | ICD-10-CM | POA: Diagnosis not present

## 2022-11-12 DIAGNOSIS — I10 Essential (primary) hypertension: Secondary | ICD-10-CM | POA: Diagnosis not present

## 2022-11-16 DIAGNOSIS — B078 Other viral warts: Secondary | ICD-10-CM | POA: Diagnosis not present

## 2022-11-16 DIAGNOSIS — L258 Unspecified contact dermatitis due to other agents: Secondary | ICD-10-CM | POA: Diagnosis not present

## 2023-01-30 DIAGNOSIS — E782 Mixed hyperlipidemia: Secondary | ICD-10-CM | POA: Insufficient documentation

## 2023-01-30 DIAGNOSIS — R072 Precordial pain: Secondary | ICD-10-CM | POA: Insufficient documentation

## 2023-01-30 NOTE — Progress Notes (Unsigned)
Patient referred by Girtha Rm, NP-C for chest pain  Subjective:   Rebecca Bowers, female    DOB: 1966/10/31, 57 y.o.   MRN: [id number]   Chief Complaint  Patient presents with   Chest Pain   New Patient (Initial Visit)   Dizziness    HPI  57 y.o. Caucasian female with hyperlipidemia, referred for chest pain  Patient works in a [employer].  She stays very active and walks a lot at work.  She does not do any other regular exercise.  Few weeks ago, she had an episode of chest pain while sitting on couch, retrosternal, lasting for 2-3 minutes, associated with dizziness and "tingling in whole body".  Patient stood up and walked outside with improvement in symptoms.  There was no worsening of dizziness with standing up.  She has had 1 similar episode of retrosternal pain radiating to the back couple of years ago.  She does not report chest pain with walking at work usually.  She was recently started on Crestor for hyperlipidemia by her PCP.   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     Past Surgical History:  Procedure Laterality Date   ANUS SURGERY  05/09/2010   Excision of AIN-III right & left anterior   CESAREAN SECTION     X2   TUBAL LIGATION     BILATERAL     Social History   Tobacco Use  Smoking Status Never  Smokeless Tobacco Never    Social History   Substance and Sexual Activity  Alcohol Use Yes   Alcohol/week: 1.0 standard drink of alcohol   Types: 1 Standard drinks or equivalent per week   Comment: 1 every 3-4 months     Family History  Problem Relation Age of Onset   Cancer Father 55       lung   Cancer Sister        lung      Current Outpatient Medications:    estradiol (ESTRACE) 2 MG tablet, , Disp: , Rfl:    Multiple Vitamin (MULTIVITAMIN) capsule, Take 1 capsule by mouth daily., Disp: , Rfl:    Multiple Vitamins-Minerals (EMERGEN-C IMMUNE PLUS)  PACK, Take 1 tablet by mouth 2 (two) times daily., Disp: 10 each, Rfl: 0   progesterone (PROMETRIUM) 100 MG capsule, , Disp: , Rfl:    promethazine-dextromethorphan (PROMETHAZINE-DM) 6.25-15 MG/5ML syrup, Take 5 mLs by mouth 4 (four) times daily as needed for cough., Disp: 120 mL, Rfl: 0   Cardiovascular and other pertinent studies:  Reviewed external labs and tests, independently interpreted  EKG 01/31/2023: Sinus rhythm 84 bpm  Nonspecific T wave abnormality    Recent labs: 10/01/2022: Glucose 82, BUN/Cr 17/0.53. EGFR 108. Na/K 137/4.6. Rest of the CMP normal H/H 14/42. MCV 90. Platelets 357 HbA1C NA Chol 239, TG 164, HDL 79, LDL 130 TSH 2.4 normal   Review of Systems  Cardiovascular:  Positive for chest pain. Negative for dyspnea on exertion, leg swelling, palpitations and syncope.         Vitals:   01/31/23 0836 01/31/23 0837  BP:    Pulse:    Resp:    SpO2: 97% 97%    Orthostatic VS for the past 72 hrs (Last 3 readings):  Orthostatic BP Patient Position BP Location Bowers Size Orthostatic Pulse  01/31/23 0837 132/85 Standing Left Arm Normal 92  01/31/23 0836 127/74 Sitting Left Arm Normal 89  01/31/23 0835 127/83 Supine Left Arm Normal 87     Body mass index is 26.66 kg/m. Filed Weights   01/31/23 0828  Weight: 132 lb (59.9 kg)     Objective:   Physical Exam Vitals and nursing note reviewed.  Constitutional:      General: She is not in acute distress. Neck:     Vascular: No JVD.  Cardiovascular:     Rate and Rhythm: Normal rate and regular rhythm.     Heart sounds: Normal heart sounds. No murmur heard. Pulmonary:     Effort: Pulmonary effort is normal.     Breath sounds: Normal breath sounds. No wheezing or rales.  Musculoskeletal:     Right lower leg: No edema.     Left lower leg: No edema.          Visit diagnoses:   ICD-10-CM   1. Precordial pain  R07.2 EKG 12-Lead    PCV CARDIAC STRESS TEST    PCV ECHOCARDIOGRAM COMPLETE    CT  CARDIAC SCORING (SELF PAY ONLY)    2. Mixed hyperlipidemia  E78.2        Orders Placed This Encounter  Procedures   CT CARDIAC SCORING (SELF PAY ONLY)   PCV CARDIAC STRESS TEST   EKG 12-Lead   PCV ECHOCARDIOGRAM COMPLETE     Assessment & Recommendations:    57 y.o. Caucasian female with hyperlipidemia, referred for chest pain  Chest pain: Fairly atypical.  Given her hyperlipidemia, recommend exercise treadmill stress test, CT cardiac scoring, and echocardiogram. If all the blood test are fairly normal, chest pain unlikely of cardiac origin.  Make hyperlipidemia: Continue Crestor 10 mg daily for now.  May adjust the dose based on CT cardiac score   Further recommendations after above testing.   Thank you for referring the patient to [place]. Please feel free to contact with any questions.   Nigel Mormon, MD Pager: [phone] Office: [phone]

## 2023-01-31 ENCOUNTER — Encounter: Payer: Self-pay | Admitting: Cardiology

## 2023-01-31 ENCOUNTER — Ambulatory Visit: Payer: BC Managed Care – PPO | Admitting: Cardiology

## 2023-01-31 VITALS — BP 138/76 | HR 84 | Resp 16 | Ht 59.0 in | Wt 132.0 lb

## 2023-01-31 DIAGNOSIS — E782 Mixed hyperlipidemia: Secondary | ICD-10-CM

## 2023-01-31 DIAGNOSIS — R072 Precordial pain: Secondary | ICD-10-CM

## 2023-02-04 ENCOUNTER — Ambulatory Visit: Payer: BC Managed Care – PPO

## 2023-02-04 DIAGNOSIS — R072 Precordial pain: Secondary | ICD-10-CM

## 2023-02-08 ENCOUNTER — Ambulatory Visit: Payer: BC Managed Care – PPO

## 2023-02-08 DIAGNOSIS — R072 Precordial pain: Secondary | ICD-10-CM

## 2023-02-11 NOTE — Progress Notes (Signed)
Gave patient results, she acknowledged understanding and had no further questions.

## 2023-06-13 ENCOUNTER — Encounter (HOSPITAL_COMMUNITY): Payer: Self-pay | Admitting: *Deleted

## 2023-06-13 ENCOUNTER — Ambulatory Visit (HOSPITAL_COMMUNITY)
Admission: EM | Admit: 2023-06-13 | Discharge: 2023-06-13 | Disposition: A | Discharge status: Home / Self Care | Payer: BC Managed Care – PPO | Attending: Family Medicine | Admitting: Family Medicine

## 2023-06-13 DIAGNOSIS — Z23 Encounter for immunization: Secondary | ICD-10-CM | POA: Diagnosis not present

## 2023-06-13 DIAGNOSIS — T25231A Burn of second degree of right toe(s) (nail), initial encounter: Secondary | ICD-10-CM | POA: Diagnosis not present

## 2023-06-13 MED ORDER — TETANUS-DIPHTH-ACELL PERTUSSIS 5-2.5-18.5 LF-MCG/0.5 IM SUSY
0.5000 mL | PREFILLED_SYRINGE | Freq: Once | INTRAMUSCULAR | Status: AC
  Administered 2023-06-13: 0.5 mL via INTRAMUSCULAR

## 2023-06-13 MED ORDER — SILVER SULFADIAZINE 1 % EX CREA: 1.0000 | TOPICAL_CREAM | Freq: Two times a day (BID) | CUTANEOUS | 0 refills | Status: AC

## 2023-06-13 MED ORDER — SILVER SULFADIAZINE 1 % EX CREA
TOPICAL_CREAM | CUTANEOUS | Status: AC
  Filled 2023-06-13: qty 85

## 2023-06-13 MED ORDER — NAPROXEN 375 MG PO TABS: 375.0000 mg | ORAL_TABLET | Freq: Two times a day (BID) | ORAL | 0 refills | Status: AC

## 2023-06-13 MED ORDER — TETANUS-DIPHTH-ACELL PERTUSSIS 5-2.5-18.5 LF-MCG/0.5 IM SUSY
PREFILLED_SYRINGE | INTRAMUSCULAR | Status: AC
  Filled 2023-06-13: qty 0.5

## 2023-06-13 MED ORDER — SILVER SULFADIAZINE 1 % EX CREA: TOPICAL_CREAM | Freq: Two times a day (BID) | CUTANEOUS | Status: DC
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
